# Patient Record
Sex: Female | Born: 1995 | Race: White | Hispanic: No | Marital: Single | Smoking: Former smoker
Health system: Southern US, Community
[De-identification: ages and names within clinical notes are randomized; demographics above are authoritative.]

## PROBLEM LIST (undated history)

## (undated) DIAGNOSIS — K219 Gastro-esophageal reflux disease without esophagitis: Secondary | ICD-10-CM

---
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
## 2016-06-15 ENCOUNTER — Emergency Department
Admission: EM | Admit: 2016-06-15 | Discharge: 2016-06-15 | Disposition: A | Discharge status: Home / Self Care | Payer: Worker's Compensation | Attending: Emergency Medicine | Admitting: Emergency Medicine

## 2016-06-15 DIAGNOSIS — S0031XA Abrasion of nose, initial encounter: Secondary | ICD-10-CM | POA: Insufficient documentation

## 2016-06-15 DIAGNOSIS — Y939 Activity, unspecified: Secondary | ICD-10-CM | POA: Insufficient documentation

## 2016-06-15 DIAGNOSIS — W01198A Fall on same level from slipping, tripping and stumbling with subsequent striking against other object, initial encounter: Secondary | ICD-10-CM | POA: Diagnosis not present

## 2016-06-15 DIAGNOSIS — T148XXA Other injury of unspecified body region, initial encounter: Secondary | ICD-10-CM

## 2016-06-15 DIAGNOSIS — S0081XA Abrasion of other part of head, initial encounter: Secondary | ICD-10-CM | POA: Diagnosis not present

## 2016-06-15 DIAGNOSIS — W19XXXA Unspecified fall, initial encounter: Secondary | ICD-10-CM

## 2016-06-15 DIAGNOSIS — S0990XA Unspecified injury of head, initial encounter: Secondary | ICD-10-CM

## 2016-06-15 DIAGNOSIS — Y929 Unspecified place or not applicable: Secondary | ICD-10-CM | POA: Diagnosis not present

## 2016-06-15 DIAGNOSIS — Y999 Unspecified external cause status: Secondary | ICD-10-CM | POA: Insufficient documentation

## 2016-06-15 LAB — POCT PREGNANCY, URINE: Preg Test, Ur: NEGATIVE

## 2016-06-15 NOTE — ED Provider Notes (Signed)
[facility] Emergency Department Provider Note  ____________________________________________   I have reviewed the triage vital signs and the nursing notes.   HISTORY  Chief Complaint Fall   History limited by: Not Limited   HPI Danielle Burton is a 20 y.o. female who presents to the emergency department today after a fall. The fall happened yesterday morning around 1030am (roughly 33 hours ago). She states that she tripped over her own feet and fell forward. She did hit her head and felt like she heard a crack but did not have any LOC. She did scrap her forehead and nose. She has since had a little bit of pain over that area. She denies any difficulty with breathing through her nose. No double vision or blurred vision. No nausea or vomiting.   No past medical history on file.  There are no active problems to display for this patient.   No past surgical history on file.  Prior to Admission medications   Not on File    Allergies Penicillins  No family history on file.  Social History Social History  Substance Use Topics  . Smoking status: Not on file  . Smokeless tobacco: Not on file  . Alcohol use Not on file    Review of Systems  Constitutional: Negative for fever. Cardiovascular: Negative for chest pain. Respiratory: Negative for shortness of breath. Gastrointestinal: Negative for abdominal pain, vomiting and diarrhea. Genitourinary: Negative for dysuria. Musculoskeletal: Negative for back pain. Negative for neck pain.  Skin: Abrasion to forehead and nose. Neurological: Negative for headaches, focal weakness or numbness.  10-point ROS otherwise negative.  ____________________________________________   PHYSICAL EXAM:  VITAL SIGNS: ED Triage Vitals  Enc Vitals Group     BP 06/15/16 1923 119/71     Pulse Rate 06/15/16 1923 87     Resp 06/15/16 1923 18     Temp 06/15/16 1923 98.4 F (36.9 C)     Temp Source 06/15/16 1923  Oral     SpO2 06/15/16 1923 99 %     Weight 06/15/16 1924 160 lb (72.6 kg)     Height 06/15/16 1924 5' 2.5" (1.588 m)     Head Circumference --      Peak Flow --      Pain Score 06/15/16 1924 7   Constitutional: Alert and oriented. Well appearing and in no distress. Eyes: Conjunctivae are normal. Normal extraocular movements. ENT   Head: Normocephalic. Small abrasion to bridge of nose and left forehead.      Ears: No hemotympanum.    Nose: No congestion/rhinnorhea. No deformity. No septal hematoma.    Mouth/Throat: Mucous membranes are moist.   Neck: No stridor. No midline tenderness.  Hematological/Lymphatic/Immunilogical: No cervical lymphadenopathy. Cardiovascular: Normal rate, regular rhythm.  No murmurs, rubs, or gallops.  Respiratory: Normal respiratory effort without tachypnea nor retractions. Breath sounds are clear and equal bilaterally. No wheezes/rales/rhonchi. Gastrointestinal: Soft and nontender. No distention.  Genitourinary: Deferred Musculoskeletal: Normal range of motion in all extremities. No lower extremity edema. Neurologic:  Normal speech and language. No gross focal neurologic deficits are appreciated.  Skin:  Skin is warm, dry. Hemostatic abrasions to left forehead and bridge of nose. Psychiatric: Mood and affect are normal. Speech and behavior are normal. Patient exhibits appropriate insight and judgment.  ____________________________________________    LABS (pertinent positives/negatives)  None  ____________________________________________   EKG  None  ____________________________________________    RADIOLOGY  None  ____________________________________________   PROCEDURES  Procedures  ____________________________________________  INITIAL IMPRESSION / ASSESSMENT AND PLAN / ED COURSE  Pertinent labs & imaging results that were available during my care of the patient were reviewed by me and considered in my medical  decision making (see chart for details).  Patient here after a fall roughly 33 hours ago. Do not think CT required at this time. Will plan on discharge home.   ____________________________________________   FINAL CLINICAL IMPRESSION(S) / ED DIAGNOSES  Final diagnoses:  Fall, initial encounter  Abrasion  Minor head injury, initial encounter     Note: This dictation was prepared with Dragon dictation. Any transcriptional errors that result from this process are unintentional    Phineas SemenGraydon Julias Mould, MD 06/15/16 2039

## 2016-06-15 NOTE — Discharge Instructions (Signed)
Please seek medical attention for any high fevers, chest pain, shortness of breath, change in behavior, persistent vomiting, bloody stool or any other new or concerning symptoms.  

## 2016-06-15 NOTE — ED Triage Notes (Signed)
Patient reports she fell while at work and struck face on floor.  Patient reports thinks she may have passed out.  Patient unsure why she fell.

## 2016-06-15 NOTE — ED Notes (Signed)
Pt with abrasion noted to nasal bridge, bleeding controlled and upper mid left forehead along hairline, bleeding controlled. Pt states she tripped over her feet, falling landing on face. Pt without oral trauma noted, no drainage from ears, nose noted. Pt appears in no acute distress. Pt states she may have passed out after her face hit floor, but is unsure. Pt denies visual acuity changed. Pt able to move all extremities without difficulty.

## 2017-10-20 DIAGNOSIS — F411 Generalized anxiety disorder: Secondary | ICD-10-CM | POA: Insufficient documentation

## 2017-10-20 DIAGNOSIS — F331 Major depressive disorder, recurrent, moderate: Secondary | ICD-10-CM | POA: Insufficient documentation

## 2017-10-27 ENCOUNTER — Emergency Department
Admission: EM | Admit: 2017-10-27 | Discharge: 2017-10-27 | Disposition: A | Discharge status: Home / Self Care | Payer: Commercial Managed Care - PPO | Attending: Emergency Medicine | Admitting: Emergency Medicine

## 2017-10-27 ENCOUNTER — Other Ambulatory Visit: Payer: Self-pay

## 2017-10-27 DIAGNOSIS — F17210 Nicotine dependence, cigarettes, uncomplicated: Secondary | ICD-10-CM | POA: Insufficient documentation

## 2017-10-27 DIAGNOSIS — R0982 Postnasal drip: Secondary | ICD-10-CM | POA: Insufficient documentation

## 2017-10-27 DIAGNOSIS — Z79899 Other long term (current) drug therapy: Secondary | ICD-10-CM | POA: Diagnosis not present

## 2017-10-27 DIAGNOSIS — J029 Acute pharyngitis, unspecified: Secondary | ICD-10-CM | POA: Diagnosis not present

## 2017-10-27 DIAGNOSIS — H9393 Unspecified disorder of ear, bilateral: Secondary | ICD-10-CM | POA: Insufficient documentation

## 2017-10-27 DIAGNOSIS — R112 Nausea with vomiting, unspecified: Secondary | ICD-10-CM | POA: Insufficient documentation

## 2017-10-27 DIAGNOSIS — R51 Headache: Secondary | ICD-10-CM | POA: Diagnosis not present

## 2017-10-27 DIAGNOSIS — J0100 Acute maxillary sinusitis, unspecified: Secondary | ICD-10-CM | POA: Diagnosis not present

## 2017-10-27 DIAGNOSIS — R0981 Nasal congestion: Secondary | ICD-10-CM | POA: Diagnosis present

## 2017-10-27 MED ORDER — SULFAMETHOXAZOLE-TRIMETHOPRIM 800-160 MG PO TABS: 1.0000 | ORAL_TABLET | Freq: Two times a day (BID) | ORAL | 0 refills | Status: DC

## 2017-10-27 MED ORDER — ONDANSETRON 8 MG PO TBDP
8.0000 mg | ORAL_TABLET | Freq: Once | ORAL | Status: AC
  Administered 2017-10-27: 8 mg via ORAL
  Filled 2017-10-27: qty 1

## 2017-10-27 MED ORDER — NAPROXEN 500 MG PO TABS: 500.0000 mg | ORAL_TABLET | Freq: Two times a day (BID) | ORAL | 0 refills | Status: DC

## 2017-10-27 MED ORDER — FEXOFENADINE-PSEUDOEPHED ER 60-120 MG PO TB12: 1.0000 | ORAL_TABLET | Freq: Two times a day (BID) | ORAL | 0 refills | Status: DC

## 2017-10-27 NOTE — ED Notes (Signed)
Pt ambulatory upon discharge. Verbalized understanding of discharge instructions, follow-up care and prescriptions. VSS. Skin warm and dry. A&O x4.  

## 2017-10-27 NOTE — ED Provider Notes (Signed)
[facility] Emergency Department Provider Note   ____________________________________________   First MD Initiated Contact with Patient 10/27/17 1434     (approximate)  I have reviewed the triage vital signs and the nursing notes.   HISTORY  Chief Complaint Nasal Congestion and Emesis    HPI Danielle Burton is a 22 y.o. female patient complain of nasal congestion for 5 days.  Patient states frontal headache and bilateral ear pressure.  Patient denies vision disturbance or vertigo.  Patient denies fever.  Patient also states sore throat secondary to postnasal drainage.  Patient states nausea and vomiting secondary to postnasal drainage.  Patient rates the pain as a 5/10.  Patient described it pain is "achy/pressure".  No palliative measures for complaint.  History reviewed. No pertinent past medical history.  There are no active problems to display for this patient.   History reviewed. No pertinent surgical history.  Prior to Admission medications   Medication Sig Start Date End Date Taking? Authorizing Provider  escitalopram (LEXAPRO) 10 MG tablet Take 10 mg by mouth daily.   Yes [provider]  fexofenadine-pseudoephedrine (ALLEGRA-D) 60-120 MG 12 hr tablet Take 1 tablet by mouth 2 (two) times daily. 10/27/17   Joni ReiningSmith, Ronald K, PA-C  naproxen (NAPROSYN) 500 MG tablet Take 1 tablet (500 mg total) by mouth 2 (two) times daily with a meal. 10/27/17   Joni ReiningSmith, Ronald K, PA-C  sulfamethoxazole-trimethoprim (BACTRIM DS,SEPTRA DS) 800-160 MG tablet Take 1 tablet by mouth 2 (two) times daily. 10/27/17   Joni ReiningSmith, Ronald K, PA-C    Allergies Penicillins  History reviewed. No pertinent family history.  Social History Social History   Tobacco Use  . Smoking status: Current Some Day Smoker    Packs/day: 0.50    Types: Cigarettes  Substance Use Topics  . Alcohol use: No    Frequency: Never  . Drug use: No    Review of Systems Constitutional:  No fever/chills Eyes: No visual changes. ENT: Nasal congestion ear pressure and sore throat.   Cardiovascular: Denies chest pain. Respiratory: Denies shortness of breath. Gastrointestinal: No abdominal pain.  No nausea, no vomiting.  No diarrhea.  No constipation. Genitourinary: Negative for dysuria. Musculoskeletal: Negative for back pain. Skin: Negative for rash. Neurological: Negative for headaches, focal weakness or numbness.   ____________________________________________   PHYSICAL EXAM:  VITAL SIGNS: ED Triage Vitals  Enc Vitals Group     BP 10/27/17 1355 (!) 135/58     Pulse Rate 10/27/17 1355 61     Resp 10/27/17 1355 16     Temp 10/27/17 1355 97.6 F (36.4 C)     Temp Source 10/27/17 1355 Oral     SpO2 10/27/17 1355 100 %     Weight 10/27/17 1357 140 lb (63.5 kg)     Height 10/27/17 1357 5\' 1"  (1.549 m)     Head Circumference --      Peak Flow --      Pain Score 10/27/17 1401 5     Pain Loc --      Pain Edu? --      Excl. in GC? --    Constitutional: Alert and oriented. Well appearing and in no acute distress. Eyes: Conjunctivae are normal. PERRL. EOMI. Head: Atraumatic. Nose: Bilateral edematous nasal turbinates.  Thick rhinorrhea.  Bilateral maxillary guarding.  Mouth/Throat: Mucous membranes are moist.  Oropharynx non-erythematous.  Postnasal drainage. Neck: No stridor.  No cervical spine tenderness to palpation. Hematological/Lymphatic/Immunilogical: No cervical lymphadenopathy. Cardiovascular: Normal rate,  regular rhythm. Grossly normal heart sounds.  Good peripheral circulation. Respiratory: Normal respiratory effort.  No retractions. Lungs CTAB. Gastrointestinal: Soft and nontender. No distention. No abdominal bruits. No CVA tenderness. Musculoskeletal: No lower extremity tenderness nor edema.  No joint effusions. Neurologic:  Normal speech and language. No gross focal neurologic deficits are appreciated. No gait instability. Skin:  Skin is warm, dry and  intact. No rash noted. Psychiatric: Mood and affect are normal. Speech and behavior are normal.  ____________________________________________   LABS (all labs ordered are listed, but only abnormal results are displayed)  Labs Reviewed - No data to display ____________________________________________  EKG   ____________________________________________  RADIOLOGY  ED MD interpretation:    Official radiology report(s): No results found.  ____________________________________________   PROCEDURES  Procedure(s) performed: None  Procedures  Critical Care performed: No  ____________________________________________   INITIAL IMPRESSION / ASSESSMENT AND PLAN / ED COURSE  As part of my medical decision making, I reviewed the following data within the electronic MEDICAL RECORD NUMBER    Patient presents with 5 days of nasal and sinus congestion consistent with sinusitis.  Patient given discharge care instruction advised take medication as directed.  Patient advised to follow-up with PCP if not improved between 5 and 7 days.     ____________________________________________   FINAL CLINICAL IMPRESSION(S) / ED DIAGNOSES  Final diagnoses:  Acute non-recurrent maxillary sinusitis     ED Discharge Orders        Ordered    sulfamethoxazole-trimethoprim (BACTRIM DS,SEPTRA DS) 800-160 MG tablet  2 times daily     10/27/17 1442    fexofenadine-pseudoephedrine (ALLEGRA-D) 60-120 MG 12 hr tablet  2 times daily     10/27/17 1442    naproxen (NAPROSYN) 500 MG tablet  2 times daily with meals     10/27/17 1442       Note:  This document was prepared using Dragon voice recognition software and may include unintentional dictation errors.    Joni Reining, PA-C 10/27/17 1505    Nita Sickle, MD 10/28/17 Barry Brunner

## 2017-10-27 NOTE — ED Notes (Signed)
See triage note  States she has had some vomiting several days ago  But conts to have nasal congestion  No fever

## 2017-10-27 NOTE — ED Triage Notes (Signed)
Pt reports that she has nasal congestions since Thursday. She reports that she had an episode of vomiting on Friday. Denies a fever. Reports that she has felt so bad that she has not been able to work.

## 2017-12-07 ENCOUNTER — Emergency Department
Admission: EM | Admit: 2017-12-07 | Discharge: 2017-12-08 | Disposition: A | Discharge status: Home / Self Care | Payer: Commercial Managed Care - PPO | Attending: Emergency Medicine | Admitting: Emergency Medicine

## 2017-12-07 ENCOUNTER — Other Ambulatory Visit: Payer: Self-pay

## 2017-12-07 ENCOUNTER — Emergency Department: Payer: Commercial Managed Care - PPO

## 2017-12-07 DIAGNOSIS — T402X1A Poisoning by other opioids, accidental (unintentional), initial encounter: Secondary | ICD-10-CM | POA: Insufficient documentation

## 2017-12-07 DIAGNOSIS — Y908 Blood alcohol level of 240 mg/100 ml or more: Secondary | ICD-10-CM | POA: Insufficient documentation

## 2017-12-07 DIAGNOSIS — F1010 Alcohol abuse, uncomplicated: Secondary | ICD-10-CM

## 2017-12-07 DIAGNOSIS — F17210 Nicotine dependence, cigarettes, uncomplicated: Secondary | ICD-10-CM | POA: Diagnosis not present

## 2017-12-07 DIAGNOSIS — F1994 Other psychoactive substance use, unspecified with psychoactive substance-induced mood disorder: Secondary | ICD-10-CM | POA: Diagnosis not present

## 2017-12-07 DIAGNOSIS — F1310 Sedative, hypnotic or anxiolytic abuse, uncomplicated: Secondary | ICD-10-CM

## 2017-12-07 DIAGNOSIS — F10129 Alcohol abuse with intoxication, unspecified: Secondary | ICD-10-CM | POA: Diagnosis not present

## 2017-12-07 DIAGNOSIS — T40601A Poisoning by unspecified narcotics, accidental (unintentional), initial encounter: Secondary | ICD-10-CM

## 2017-12-07 DIAGNOSIS — F329 Major depressive disorder, single episode, unspecified: Secondary | ICD-10-CM | POA: Diagnosis not present

## 2017-12-07 DIAGNOSIS — T50901A Poisoning by unspecified drugs, medicaments and biological substances, accidental (unintentional), initial encounter: Secondary | ICD-10-CM

## 2017-12-07 DIAGNOSIS — F10929 Alcohol use, unspecified with intoxication, unspecified: Secondary | ICD-10-CM

## 2017-12-07 LAB — COMPREHENSIVE METABOLIC PANEL
ALT: 20 U/L (ref 14–54)
AST: 26 U/L (ref 15–41)
Albumin: 4.1 g/dL (ref 3.5–5.0)
Alkaline Phosphatase: 47 U/L (ref 38–126)
Anion gap: 10 (ref 5–15)
BUN: 9 mg/dL (ref 6–20)
CHLORIDE: 103 mmol/L (ref 101–111)
CO2: 25 mmol/L (ref 22–32)
CREATININE: 0.53 mg/dL (ref 0.44–1.00)
Calcium: 8.3 mg/dL — ABNORMAL LOW (ref 8.9–10.3)
Glucose, Bld: 95 mg/dL (ref 65–99)
Potassium: 3.6 mmol/L (ref 3.5–5.1)
Sodium: 138 mmol/L (ref 135–145)
Total Bilirubin: 0.6 mg/dL (ref 0.3–1.2)
Total Protein: 7.5 g/dL (ref 6.5–8.1)

## 2017-12-07 LAB — CBC WITH DIFFERENTIAL/PLATELET
BASOS PCT: 1 %
Basophils Absolute: 0.1 10*3/uL (ref 0–0.1)
EOS ABS: 0.1 10*3/uL (ref 0–0.7)
EOS PCT: 1 %
HCT: 45.2 % (ref 35.0–47.0)
HEMOGLOBIN: 15.8 g/dL (ref 12.0–16.0)
Lymphocytes Relative: 23 %
Lymphs Abs: 2.2 10*3/uL (ref 1.0–3.6)
MCH: 32.2 pg (ref 26.0–34.0)
MCHC: 34.9 g/dL (ref 32.0–36.0)
MCV: 92.2 fL (ref 80.0–100.0)
MONO ABS: 0.6 10*3/uL (ref 0.2–0.9)
MONOS PCT: 6 %
NEUTROS PCT: 69 %
Neutro Abs: 6.6 10*3/uL — ABNORMAL HIGH (ref 1.4–6.5)
PLATELETS: 305 10*3/uL (ref 150–440)
RBC: 4.91 MIL/uL (ref 3.80–5.20)
RDW: 14.7 % — AB (ref 11.5–14.5)
WBC: 9.6 10*3/uL (ref 3.6–11.0)

## 2017-12-07 LAB — URINALYSIS, COMPLETE (UACMP) WITH MICROSCOPIC
Bacteria, UA: NONE SEEN
Bilirubin Urine: NEGATIVE
GLUCOSE, UA: NEGATIVE mg/dL
Hgb urine dipstick: NEGATIVE
KETONES UR: NEGATIVE mg/dL
LEUKOCYTES UA: NEGATIVE
NITRITE: NEGATIVE
PROTEIN: NEGATIVE mg/dL
Specific Gravity, Urine: 1.002 — ABNORMAL LOW (ref 1.005–1.030)
WBC, UA: NONE SEEN WBC/hpf (ref 0–5)
pH: 6 (ref 5.0–8.0)

## 2017-12-07 LAB — URINE DRUG SCREEN, QUALITATIVE (ARMC ONLY)
Amphetamines, Ur Screen: NOT DETECTED
BARBITURATES, UR SCREEN: NOT DETECTED
Benzodiazepine, Ur Scrn: NOT DETECTED
Cannabinoid 50 Ng, Ur ~~LOC~~: NOT DETECTED
Cocaine Metabolite,Ur ~~LOC~~: NOT DETECTED
MDMA (Ecstasy)Ur Screen: NOT DETECTED
METHADONE SCREEN, URINE: NOT DETECTED
Opiate, Ur Screen: POSITIVE — AB
Phencyclidine (PCP) Ur S: NOT DETECTED
TRICYCLIC, UR SCREEN: POSITIVE — AB

## 2017-12-07 LAB — SALICYLATE LEVEL: Salicylate Lvl: <7 mg/dL (ref 2.8–30.0)

## 2017-12-07 LAB — ACETAMINOPHEN LEVEL

## 2017-12-07 LAB — HCG, QUANTITATIVE, PREGNANCY: hCG, Beta Chain, Quant, S: <1 m[IU]/mL (ref <5)

## 2017-12-07 LAB — ETHANOL: ALCOHOL ETHYL (B): 260 mg/dL — AB (ref <10)

## 2017-12-07 MED ORDER — SODIUM CHLORIDE 0.9 % IV BOLUS
1000.0000 mL | Freq: Once | INTRAVENOUS | Status: AC
  Administered 2017-12-07: 1000 mL via INTRAVENOUS

## 2017-12-07 NOTE — ED Triage Notes (Addendum)
Per EMS, pt was unresponsive at a friends house, friend states ETOH, unknown if other substance was ingested. EMS gave  of narcan with response. EMS reports pt was combative with them and pulled out her IV. On arrival pt alert to painful stimuli, otherwise unresp. PT has equal and unlabored resp noted. EDP in rm.

## 2017-12-07 NOTE — ED Provider Notes (Signed)
[facility] Emergency Department Provider Note  ____________________________________________   First MD Initiated Contact with Patient 12/07/17 2217     (approximate)  I have reviewed the triage vital signs and the nursing notes.   HISTORY  Chief Complaint Ingestion  History limited by the patient's clinical condition  HPI Danielle Burton is a 22 y.o. female who comes to the emergency department via EMS after a possible overdose.  EMS was called out for an unresponsive patient and when they got there the patient's roommate said the patient had been drinking alcohol but then was evasive about answering further questions.  EMS noted the patient had a low respiratory rate and pinpoint pupils so they administered 4 mg of Narcan intranasally with a good response.  They subsequently gave 1 mg IV and the patient became combative in route.  History reviewed. No pertinent past medical history.  There are no active problems to display for this patient.   History reviewed. No pertinent surgical history.  Prior to Admission medications   Medication Sig Start Date End Date Taking? Authorizing Provider  escitalopram (LEXAPRO) 10 MG tablet Take 10 mg by mouth daily.   Yes [provider]  naloxone Jonelle Sports) 2 MG/2ML injection Use intranasal as needed for opiate overdose 12/08/17   Merrily Brittle, MD    Allergies Penicillins  No family history on file.  Social History Social History   Tobacco Use  . Smoking status: Current Some Day Smoker    Packs/day: 0.50    Types: Cigarettes  . Smokeless tobacco: Never Used  Substance Use Topics  . Alcohol use: Yes    Frequency: Never  . Drug use: No    Review of Systems History is limited by the patient's clinical condition  ____________________________________________   PHYSICAL EXAM:  VITAL SIGNS: ED Triage Vitals  Enc Vitals Group     BP      Pulse      Resp      Temp      Temp src    SpO2      Weight      Height      Head Circumference      Peak Flow      Pain Score      Pain Loc      Pain Edu?      Excl. in GC?     Constitutional: Somnolent and minimally arousable Eyes: PERRL EOMI. midrange and brisk Head: Atraumatic. Nose: No congestion/rhinnorhea. Mouth/Throat: No trismus Neck: No stridor.   Cardiovascular: Normal rate, regular rhythm. Grossly normal heart sounds.  Good peripheral circulation. Respiratory: Somewhat decreased respiratory effort although clear to auscultation Gastrointestinal: Soft nontender Musculoskeletal: No lower extremity edema   Neurologic: Moves all 4 Skin:  Skin is warm, dry and intact. No rash noted. Psychiatric: Somnolent   ____________________________________________   DIFFERENTIAL includes but not limited to  Alcohol intoxication, heroin overdose, metabolic derangement, dehydration ____________________________________________   LABS (all labs ordered are listed, but only abnormal results are displayed)  Labs Reviewed  COMPREHENSIVE METABOLIC PANEL - Abnormal; Notable for the following components:      Result Value   Calcium 8.3 (*)    All other components within normal limits  ACETAMINOPHEN LEVEL - Abnormal; Notable for the following components:   Acetaminophen (Tylenol), Serum <10 (*)    All other components within normal limits  ETHANOL - Abnormal; Notable for the following components:   Alcohol, Ethyl (B) 260 (*)    All  other components within normal limits  CBC WITH DIFFERENTIAL/PLATELET - Abnormal; Notable for the following components:   RDW 14.7 (*)    Neutro Abs 6.6 (*)    All other components within normal limits  URINE DRUG SCREEN, QUALITATIVE ([facility] ONLY) - Abnormal; Notable for the following components:   Tricyclic, Ur Screen POSITIVE (*)    Opiate, Ur Screen POSITIVE (*)    All other components within normal limits  URINALYSIS, COMPLETE (UACMP) WITH MICROSCOPIC - Abnormal; Notable for the following  components:   Color, Urine COLORLESS (*)    APPearance CLEAR (*)    Specific Gravity, Urine 1.002 (*)    All other components within normal limits  SALICYLATE LEVEL  HCG, QUANTITATIVE, PREGNANCY    Lab work reviewed by me shows the patient is significantly intoxicated with alcohol as well as opiate and try cyclic positive.  Stress likely could very well be Benadryl to enhance the opioids __________________________________________  EKG  ED ECG REPORT I, Merrily Brittle, the attending physician, personally viewed and interpreted this ECG.  Date: 12/08/2017 EKG Time:  Rate: 94 Rhythm: normal sinus rhythm QRS Axis: normal Intervals: normal ST/T Wave abnormalities: normal Narrative Interpretation: no evidence of acute ischemia  ____________________________________________  RADIOLOGY  Chest x-ray reviewed by me with no acute disease ____________________________________________   PROCEDURES  Procedure(s) performed: no  Procedures  Critical Care performed: no  Observation: no ____________________________________________   INITIAL IMPRESSION / ASSESSMENT AND PLAN / ED COURSE  Pertinent labs & imaging results that were available during my care of the patient were reviewed by me and considered in my medical decision making (see chart for details).  On arrival the patient is somnolent but arousable.  She has a slightly low respiratory rate but not enough to give more Narcan.  Broad labs are pending including a chest x-ray as she was reversed with Narcan.  ----------------------------------------- 1:11 AM on 12/08/2017 -----------------------------------------  The patient's drug screen is back opiate and tricyclic positive.  The tricyclics could very well be Benadryl to enhance the opiates.  Patient's parents are at bedside and I had a lengthy discussion.  At this point patient is still too somnolent to safely discharged home.  I have consulted TTS to provide the parents  with resources.  Care signed over to Dr. Zenda Alpers who will determine the ultimate disposition.      ____________________________________________   FINAL CLINICAL IMPRESSION(S) / ED DIAGNOSES  Final diagnoses:  Alcoholic intoxication with complication (HCC)  Accidental drug overdose, initial encounter  Opiate overdose, accidental or unintentional, initial encounter (HCC)      NEW MEDICATIONS STARTED DURING THIS VISIT:  New Prescriptions   NALOXONE (NARCAN) 2 MG/2ML INJECTION    Use intranasal as needed for opiate overdose     Note:  This document was prepared using Dragon voice recognition software and may include unintentional dictation errors.     Merrily Brittle, MD 12/08/17 0111

## 2017-12-07 NOTE — ED Notes (Signed)
Pt continuing to sleep at this time, equal and unlabored resp noted. Family at bedside.

## 2017-12-08 DIAGNOSIS — F1994 Other psychoactive substance use, unspecified with psychoactive substance-induced mood disorder: Secondary | ICD-10-CM | POA: Diagnosis not present

## 2017-12-08 DIAGNOSIS — F1310 Sedative, hypnotic or anxiolytic abuse, uncomplicated: Secondary | ICD-10-CM

## 2017-12-08 DIAGNOSIS — F1010 Alcohol abuse, uncomplicated: Secondary | ICD-10-CM

## 2017-12-08 MED ORDER — CHLORDIAZEPOXIDE HCL 25 MG PO CAPS
25.0000 mg | ORAL_CAPSULE | Freq: Three times a day (TID) | ORAL | Status: DC
  Administered 2017-12-08: 25 mg via ORAL
  Filled 2017-12-08: qty 1

## 2017-12-08 MED ORDER — NALOXONE HCL 4 MG/0.1ML NA LIQD: 1.0000 | Freq: Once | NASAL | Status: DC

## 2017-12-08 MED ORDER — VITAMIN B-1 100 MG PO TABS
100.0000 mg | ORAL_TABLET | Freq: Every day | ORAL | Status: DC
  Administered 2017-12-08: 100 mg via ORAL
  Filled 2017-12-08: qty 1

## 2017-12-08 MED ORDER — FOLIC ACID 1 MG PO TABS
1.0000 mg | ORAL_TABLET | Freq: Every day | ORAL | Status: DC
  Administered 2017-12-08: 1 mg via ORAL
  Filled 2017-12-08: qty 1

## 2017-12-08 MED ORDER — NALOXONE HCL 2 MG/2ML IJ SOSY: PREFILLED_SYRINGE | INTRAMUSCULAR | 2 refills | Status: DC

## 2017-12-08 MED ORDER — SODIUM CHLORIDE 0.9 % IV BOLUS
1000.0000 mL | Freq: Once | INTRAVENOUS | Status: AC
  Administered 2017-12-08: 1000 mL via INTRAVENOUS

## 2017-12-08 NOTE — ED Notes (Signed)
Pt dressed out in burgandy scrubs.  Pt belongings (jewelry and cell phone) handed off to pt's father at this time.

## 2017-12-08 NOTE — ED Provider Notes (Signed)
Seen by psych SOC, recommends voluntary admission.   Danielle Burton, RoJene Every/[birth year] [phone]

## 2017-12-08 NOTE — BH Assessment (Signed)
Danielle Burton  Danielle Burton is an 22 y.o. female. Danielle Burton arrived to the ED by way of EMS. She report that she was talking to her friend and did not think she was too drunk, "but I guess I blacked out".  She reports that she has no idea how much she drank.  She states "I had a couple beers, and then it went on to liquor".  She reports that she was at her friends house.  She states that she had taken opioids (Klonipines).  She shared that she remembered taking 3 -  tablets.  She states that she cannot remember past 8 o'clock.  She states "I am always depressed" She states that she is on anti-depressants and always feels sad.  She denied symptoms of anxiety.  She denied having auditory or visual hallucinations.  She denied suicidal and homicidal ideation or intent.   She denied stressors or life changes.  Diagnosis: Depression, Substance use disorder  Past Medical History: History reviewed. No pertinent past medical history.  History reviewed. No pertinent surgical history.  Family History: No family history on file.  Social History:  reports that she has been smoking cigarettes.  She has been smoking about 0.50 packs per day. She has never used smokeless tobacco. She reports that she drinks alcohol. She reports that she does not use drugs.  Additional Social History:  Alcohol / Drug Use History of alcohol / drug use?: Yes Substance #1 Name of Substance 1: Alcohol 1 - Age of First Use: 15 1 - Amount (size/oz): "quite a bit" 1 - Frequency: 3 nights a week 1 - Last Use / Amount: 12/07/2017 Substance #2 Name of Substance 2: opiods 2 - Age of First Use: 20 2 - Amount (size/oz): "Quite a bit" 2 - Frequency: 3-4 days a week 2 - Last Use / Amount: 12/07/2017  CIWA: CIWA-Ar BP: (!) 87/48 Pulse Rate: 80 COWS:    Allergies:  Allergies  Allergen Reactions  . Penicillins Rash    Home Medications:  (Not in a hospital admission)  OB/GYN Status:  No LMP recorded.  General  Danielle Data Location of Danielle: [facility] ED TTS Danielle: In system Is this a Tele or Face-to-Face Danielle?: Face-to-Face Is this an Initial Danielle or a Re-Danielle for this encounter?: Initial Danielle Marital status: Single Maiden name: Danielle Burton Is patient pregnant?: No Pregnancy Status: No Living Arrangements: Parent Can pt return to current living arrangement?: Yes Admission Status: Voluntary Is patient capable of signing voluntary admission?: Yes Referral Source: Self/Family/Friend Insurance type: Designer, industrial/product Exam [facility] Walk-in ONLY) Medical Exam completed: Yes  Crisis Care Plan Living Arrangements: Parent Legal Guardian: Other:(Self) Name of Psychiatrist: None Name of Therapist: None  Education Status Is patient currently in school?: No Is the patient employed, unemployed or receiving disability?: Employed  Risk to self with the past 6 months Suicidal Ideation: No Has patient been a risk to self within the past 6 months prior to admission? : No Suicidal Intent: No Has patient had any suicidal intent within the past 6 months prior to admission? : No Is patient at risk for suicide?: No Suicidal Plan?: No Has patient had any suicidal plan within the past 6 months prior to admission? : No Access to Means: No What has been your use of drugs/alcohol within the last 12 months?: "I have done a lot of drugs" Previous Attempts/Gestures: No How many times?: 0 Other Self Harm Risks: denied Triggers for Past Attempts: None known Intentional Self Injurious Behavior:  None Family Suicide History: Yes(Aunt, paternal grandmother) Recent stressful life event(s): (denied) Persecutory voices/beliefs?: No Depression: Yes Depression Symptoms: Tearfulness Substance abuse history and/or treatment for substance abuse?: Yes Suicide prevention information given to non-admitted patients: Not applicable  Risk to Others within the past 6 months Homicidal  Ideation: No Does patient have any lifetime risk of violence toward others beyond the six months prior to admission? : No Thoughts of Harm to Others: No Current Homicidal Intent: No Current Homicidal Plan: No Access to Homicidal Means: No Identified Victim: None identified History of harm to others?: No Danielle of Violence: None Noted Violent Behavior Description: denied Does patient have access to weapons?: No Criminal Charges Pending?: No Does patient have a court date: No Is patient on probation?: No  Psychosis Hallucinations: None noted Delusions: None noted  Mental Status Report Appearance/Hygiene: In hospital gown Eye Contact: Fair Motor Activity: Unremarkable Speech: Logical/coherent Level of Consciousness: Alert Mood: Other (Comment)(lauging at incongruent moments) Affect: Sad Anxiety Level: None Thought Processes: Coherent Judgement: Partial Orientation: Appropriate for developmental age Obsessive Compulsive Thoughts/Behaviors: None  Cognitive Functioning Concentration: Good Memory: Remote Intact Is patient IDD: No Is patient DD?: No Insight: Poor Impulse Control: Poor Appetite: Poor Have you had any weight changes? : No Change Sleep: Decreased Vegetative Symptoms: None  ADLScreening [facility] Danielle Services) Patient's cognitive ability adequate to safely complete daily activities?: Yes Patient able to express need for assistance with ADLs?: Yes Independently performs ADLs?: Yes (appropriate for developmental age)  Prior Inpatient Therapy Prior Inpatient Therapy: No  Prior Outpatient Therapy Prior Outpatient Therapy: Yes Prior Therapy Dates: 2017 and prior Prior Therapy Facilty/Provider(s): "Some place in [place]" Reason for Treatment: depression and anxiety Does patient have an ACCT team?: No Does patient have Intensive In-House Services?  : No Does patient have [facility] services? : No Does patient have P4CC services?: No  ADL Screening  (condition at time of admission) Patient's cognitive ability adequate to safely complete daily activities?: Yes Is the patient deaf or have difficulty hearing?: No Does the patient have difficulty seeing, even when wearing glasses/contacts?: No Does the patient have difficulty concentrating, remembering, or making decisions?: No Patient able to express need for assistance with ADLs?: Yes Does the patient have difficulty dressing or bathing?: No Independently performs ADLs?: Yes (appropriate for developmental age) Does the patient have difficulty walking or climbing stairs?: No Weakness of Legs: None Weakness of Arms/Hands: None  Home Assistive Devices/Equipment Home Assistive Devices/Equipment: None    Abuse/Neglect Danielle (Danielle to be complete while patient is alone) Abuse/Neglect Danielle Can Be Completed: (denied history of abuse)     Advance Directives (For Healthcare) Does Patient Have a Medical Advance Directive?: (Unable to assess, pt unresp at this time)          Disposition:  Disposition Initial Danielle Completed for this Encounter: Yes  On Site Evaluation by:   Reviewed with Physician:    Justice Deeds 12/08/2017 5:34 AM

## 2017-12-08 NOTE — ED Provider Notes (Signed)
Patient cleared for discharge by Dr. Toni Amend of psychiatry after his evaluation    [name], MD 12/08/17 1158

## 2017-12-08 NOTE — ED Notes (Signed)
Called [facility] for consult  [phone]

## 2017-12-08 NOTE — ED Notes (Signed)
SOC set up in interview room, will walk patient into interview room when notified of call from [facility].

## 2017-12-08 NOTE — ED Notes (Signed)
Assessment could not be completed at this time, as patient could not be aroused due to elevated alcohol level and substance use.

## 2017-12-08 NOTE — ED Notes (Signed)
Pt given sprite to drink. 

## 2017-12-08 NOTE — Consult Note (Signed)
[place] Psychiatry Consult   Reason for Consult: Consult for 22 year old woman brought to the emergency room last night after police were apparently called to where she had had a blackout Referring Physician: Corky Downs Patient Identification: Danielle Burton MRN:  [id number] Principal Diagnosis: Substance induced mood disorder ([place]) Diagnosis:   Patient Active Problem List   Diagnosis Date Noted  . Alcohol abuse [F10.10] 12/08/2017  . Substance induced mood disorder ([place]) [F19.94] 12/08/2017  . Benzodiazepine abuse ([place]) [F13.10] 12/08/2017    Total Time spent with patient: 1 hour  Subjective:   Danielle Burton is a 22 y.o. female patient admitted with "I really do not remember"  HPI: Patient seen chart reviewed.  22 year old woman came to the emergency room last night.  Apparently EMS got called because the patient had taken some pills after drinking heavily and blacked out.  Patient says the last thing she remembers was yesterday evening she was drinking with some friends.  Does not know how much she had to drink but knows that she was drinking beer and then started drinking liquor.  Significantly more she thinks and she does most days.  She took some pills given to her by some friends which she was told were Klonopin.  Next thing she knew she was here in the hospital.  Patient says that she only did it because she was trying to get high and have fun.  She denies that she is been having any suicidal thoughts at all.  She says she has chronic depression but it has not been any worse than usual.  Usually sleeps and eats okay.  Currently does see a provider who prescribes Lexapro for her.  Denies any psychotic symptoms.  Patient claims that she drinks only occasionally not every day.  Denies that she is abusing any other drugs.  Social history: Lives with her parents.  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  Medical history: No significant medical problems  Substance abuse  history: Sounds like she has had problems with intermittent abuse of alcohol and other drugs although she has little insight into it and has not engaged in any treatment.  Past Psychiatric History: No history of suicide attempts no history of hospitalization.  Currently prescribed Lexapro.  Risk to Self: Suicidal Ideation: No Suicidal Intent: No Is patient at risk for suicide?: No Suicidal Plan?: No Access to Means: No What has been your use of drugs/alcohol within the last 12 months?: "I have done a lot of drugs" How many times?: 0 Other Self Harm Risks: denied Triggers for Past Attempts: None known Intentional Self Injurious Behavior: None Risk to Others: Homicidal Ideation: No Thoughts of Harm to Others: No Current Homicidal Intent: No Current Homicidal Plan: No Access to Homicidal Means: No Identified Victim: None identified History of harm to others?: No Assessment of Violence: None Noted Violent Behavior Description: denied Does patient have access to weapons?: No Criminal Charges Pending?: No Does patient have a court date: No Prior Inpatient Therapy: Prior Inpatient Therapy: No Prior Outpatient Therapy: Prior Outpatient Therapy: Yes Prior Therapy Dates: 2017 and prior Prior Therapy Facilty/Provider(s): "Some place in [place]" Reason for Treatment: depression and anxiety Does patient have an ACCT team?: No Does patient have Intensive In-House Services?  : No Does patient have [facility] services? : No Does patient have P4CC services?: No  Past Medical History: History reviewed. No pertinent past medical history. History reviewed. No pertinent surgical history. Family History: No family history on file. Family Psychiatric  History:  Grandmother had depression Social History:  Social History   Substance and Sexual Activity  Alcohol Use Yes  . Frequency: Never     Social History   Substance and Sexual Activity  Drug Use No    Social History   Socioeconomic  History  . Marital status: Single    Spouse name: Not on file  . Number of children: Not on file  . Years of education: Not on file  . Highest education level: Not on file  Occupational History  . Not on file  Social Needs  . Financial resource strain: Not on file  . Food insecurity:    Worry: Not on file    Inability: Not on file  . Transportation needs:    Medical: Not on file    Non-medical: Not on file  Tobacco Use  . Smoking status: Current Some Day Smoker    Packs/day: 0.50    Types: Cigarettes  . Smokeless tobacco: Never Used  Substance and Sexual Activity  . Alcohol use: Yes    Frequency: Never  . Drug use: No  . Sexual activity: Not on file  Lifestyle  . Physical activity:    Days per week: Not on file    Minutes per session: Not on file  . Stress: Not on file  Relationships  . Social connections:    Talks on phone: Not on file    Gets together: Not on file    Attends religious service: Not on file    Active member of club or organization: Not on file    Attends meetings of clubs or organizations: Not on file    Relationship status: Not on file  Other Topics Concern  . Not on file  Social History Narrative  . Not on file   Additional Social History:    Allergies:   Allergies  Allergen Reactions  . Penicillins Rash    Labs:  Results for orders placed or performed during the hospital encounter of 12/07/17 (from the past 48 hour(s))  Comprehensive metabolic panel     Status: Abnormal   Collection Time: 12/07/17 10:19 PM  Result Value Ref Range   Sodium 138 135 - 145 mmol/L   Potassium 3.6 3.5 - 5.1 mmol/L    Comment: HEMOLYSIS AT THIS LEVEL MAY AFFECT RESULT   Chloride 103 101 - 111 mmol/L   CO2 25 22 - 32 mmol/L   Glucose, Bld 95 65 - 99 mg/dL   BUN 9 6 - 20 mg/dL   Creatinine, Ser 0.53 0.44 - 1.00 mg/dL   Calcium 8.3 (L) 8.9 - 10.3 mg/dL   Total Protein 7.5 6.5 - 8.1 g/dL   Albumin 4.1 3.5 - 5.0 g/dL   AST 26 15 - 41 U/L   ALT 20 14 - 54  U/L   Alkaline Phosphatase 47 38 - 126 U/L   Total Bilirubin 0.6 0.3 - 1.2 mg/dL   GFR calc non Af Amer >60 >60 mL/min   GFR calc Af Amer >60 >60 mL/min    Comment: (NOTE) The eGFR has been calculated using the CKD EPI equation. This calculation has not been validated in all clinical situations. eGFR's persistently <60 mL/min signify possible Chronic Kidney Disease.    Anion gap 10 5 - 15    Comment: Performed at [facility], [address] [place], [place] [address]  Acetaminophen level     Status: Abnormal   Collection Time: 12/07/17 10:19 PM  Result Value Ref Range  Acetaminophen (Tylenol), Serum <10 (L) 10 - 30 ug/mL    Comment:        THERAPEUTIC CONCENTRATIONS VARY SIGNIFICANTLY. A RANGE OF 10-30 ug/mL MAY BE AN EFFECTIVE CONCENTRATION FOR MANY PATIENTS. HOWEVER, SOME ARE BEST TREATED AT CONCENTRATIONS OUTSIDE THIS RANGE. ACETAMINOPHEN CONCENTRATIONS >150 ug/mL AT 4 HOURS AFTER INGESTION AND >50 ug/mL AT 12 HOURS AFTER INGESTION ARE OFTEN ASSOCIATED WITH TOXIC REACTIONS. Performed at [facility], [address] [place], [place] [address]   Ethanol     Status: Abnormal   Collection Time: 12/07/17 10:19 PM  Result Value Ref Range   Alcohol, Ethyl (B) 260 (H) <10 mg/dL    Comment:        LOWEST DETECTABLE LIMIT FOR SERUM ALCOHOL IS 10 mg/dL FOR MEDICAL PURPOSES ONLY Performed at [facility], [address] [place], [place] [address]   Salicylate level     Status: None   Collection Time: 12/07/17 10:19 PM  Result Value Ref Range   Salicylate Lvl <1.0 2.8 - 30.0 mg/dL    Comment: HEMOLYSIS AT THIS LEVEL MAY AFFECT RESULT Performed at [facility], [address] [place], [place] [address]   CBC with Differential     Status: Abnormal   Collection Time: 12/07/17 10:19 PM  Result Value Ref Range   WBC 9.6 3.6 - 11.0 K/uL   RBC 4.91 3.80 - 5.20 MIL/uL   Hemoglobin 15.8 12.0 - 16.0 g/dL   HCT 45.2 35.0 - 47.0 %   MCV  92.2 80.0 - 100.0 fL   MCH 32.2 26.0 - 34.0 pg   MCHC 34.9 32.0 - 36.0 g/dL   RDW 14.7 (H) 11.5 - 14.5 %   Platelets 305 150 - 440 K/uL   Neutrophils Relative % 69 %   Neutro Abs 6.6 (H) 1.4 - 6.5 K/uL   Lymphocytes Relative 23 %   Lymphs Abs 2.2 1.0 - 3.6 K/uL   Monocytes Relative 6 %   Monocytes Absolute 0.6 0.2 - 0.9 K/uL   Eosinophils Relative 1 %   Eosinophils Absolute 0.1 0 - 0.7 K/uL   Basophils Relative 1 %   Basophils Absolute 0.1 0 - 0.1 K/uL    Comment: Performed at [facility], [address] [place], [place] [address]  Urine Drug Screen, Qualitative     Status: Abnormal   Collection Time: 12/07/17 10:19 PM  Result Value Ref Range   Tricyclic, Ur Screen POSITIVE (A) NONE DETECTED   Amphetamines, Ur Screen NONE DETECTED NONE DETECTED   MDMA (Ecstasy)Ur Screen NONE DETECTED NONE DETECTED   Cocaine Metabolite,Ur [place] NONE DETECTED NONE DETECTED   Opiate, Ur Screen POSITIVE (A) NONE DETECTED   Phencyclidine (PCP) Ur S NONE DETECTED NONE DETECTED   Cannabinoid 50 Ng, Ur [place] NONE DETECTED NONE DETECTED   Barbiturates, Ur Screen NONE DETECTED NONE DETECTED   Benzodiazepine, Ur Scrn NONE DETECTED NONE DETECTED   Methadone Scn, Ur NONE DETECTED NONE DETECTED    Comment: (NOTE) Tricyclics + metabolites, urine    Cutoff 1000 ng/mL Amphetamines + metabolites, urine  Cutoff 1000 ng/mL MDMA (Ecstasy), urine              Cutoff 500 ng/mL Cocaine Metabolite, urine          Cutoff 300 ng/mL Opiate + metabolites, urine        Cutoff 300 ng/mL Phencyclidine (PCP), urine         Cutoff 25 ng/mL Cannabinoid, urine  Cutoff 50 ng/mL Barbiturates + metabolites, urine  Cutoff 200 ng/mL Benzodiazepine, urine              Cutoff 200 ng/mL Methadone, urine                   Cutoff 300 ng/mL The urine drug screen provides only a preliminary, unconfirmed analytical test result and should not be used for non-medical purposes. Clinical consideration and professional  judgment should be applied to any positive drug screen result due to possible interfering substances. A more specific alternate chemical method must be used in order to obtain a confirmed analytical result. Gas chromatography / mass spectrometry (GC/MS) is the preferred confirmat ory method. Performed at [facility], [address] [place], [place] [address]   Urinalysis, Complete w Microscopic     Status: Abnormal   Collection Time: 12/07/17 10:19 PM  Result Value Ref Range   Color, Urine COLORLESS (A) YELLOW   APPearance CLEAR (A) CLEAR   Specific Gravity, Urine 1.002 (L) 1.005 - 1.030   pH 6.0 5.0 - 8.0   Glucose, UA NEGATIVE NEGATIVE mg/dL   Hgb urine dipstick NEGATIVE NEGATIVE   Bilirubin Urine NEGATIVE NEGATIVE   Ketones, ur NEGATIVE NEGATIVE mg/dL   Protein, ur NEGATIVE NEGATIVE mg/dL   Nitrite NEGATIVE NEGATIVE   Leukocytes, UA NEGATIVE NEGATIVE   WBC, UA NONE SEEN 0 - 5 WBC/hpf   Bacteria, UA NONE SEEN NONE SEEN   Squamous Epithelial / LPF 0-5 0 - 5    Comment: Please note change in reference range. Performed at [facility], [address] [place], [place] [address]   hCG, quantitative, pregnancy     Status: None   Collection Time: 12/07/17 10:19 PM  Result Value Ref Range   hCG, Beta Chain, Quant, S <1 <5 mIU/mL    Comment:          GEST. AGE      CONC.  (mIU/mL)   <=1 WEEK        5 - 50     2 WEEKS       50 - 500     3 WEEKS       100 - 10,000     4 WEEKS     1,000 - 30,000     5 WEEKS     3,500 - 115,000   6-8 WEEKS     12,000 - 270,000    12 WEEKS     15,000 - 220,000        FEMALE AND NON-PREGNANT FEMALE:     LESS THAN 5 mIU/mL Performed at [facility], [address] [place], [place] [address]     No current facility-administered medications for this encounter.    Current Outpatient Medications  Medication Sig Dispense Refill  . escitalopram (LEXAPRO) 10 MG tablet Take 10 mg by mouth daily.    . naloxone (NARCAN)  2 MG/2ML injection Use intranasal as needed for opiate overdose 2 mL 2    Musculoskeletal: Strength & Muscle Tone: within normal limits Gait & Station: normal Patient leans: N/A  Psychiatric Specialty Exam: Physical Exam  Nursing note and vitals reviewed. Constitutional: She appears well-developed and well-nourished.  HENT:  Head: Normocephalic and atraumatic.  Eyes: Pupils are equal, round, and reactive to light. Conjunctivae are normal.  Neck: Normal range of motion.  Cardiovascular: Regular rhythm and normal heart sounds.  Respiratory: Effort normal. No respiratory distress.  GI: Soft.  Musculoskeletal: Normal  range of motion.  Neurological: She is alert.  Skin: Skin is warm and dry.  Psychiatric: She has a normal mood and affect. Her behavior is normal. Judgment and thought content normal.    Review of Systems  Constitutional: Negative.   HENT: Negative.   Eyes: Negative.   Respiratory: Negative.   Cardiovascular: Negative.   Gastrointestinal: Negative.   Musculoskeletal: Negative.   Skin: Negative.   Neurological: Negative.   Psychiatric/Behavioral: Positive for memory loss and substance abuse. Negative for depression, hallucinations and suicidal ideas. The patient is not nervous/anxious and does not have insomnia.     Blood pressure 120/79, pulse 98, temperature 97.6 F (36.4 C), temperature source Axillary, resp. rate 16, height _0  (1.626 m), weight 59 kg (130 lb), SpO2 99 %.Body mass index is 22.31 kg/m.  General Appearance: Casual  Eye Contact:  Good  Speech:  Clear and Coherent  Volume:  Decreased  Mood:  Euthymic  Affect:  Congruent  Thought Process:  Goal Directed  Orientation:  Full (Time, Place, and Person)  Thought Content:  Logical  Suicidal Thoughts:  No  Homicidal Thoughts:  No  Memory:  Immediate;   Fair Recent;   Fair Remote;   Fair  Judgement:  Impaired  Insight:  Shallow  Psychomotor Activity:  Normal  Concentration:  Concentration:  Fair  Recall:  [employer] of Knowledge:  Fair  Language:  Fair  Akathisia:  No  Handed:  Right  AIMS (if indicated):     Assets:  Communication Skills Housing Physical Health Resilience  ADL's:  Intact  Cognition:  WNL  Sleep:        Treatment Plan Summary: Plan 22 year old woman who came to the hospital intoxicated with drug screen positive for opiates.  Patient does not even know what pills she took but it does not appear that there is any evidence that she was trying to kill herself.  Says she has chronic mild depression but is not particularly sad or depressed now denies any suicidal ideation.  No sign of acute dangerousness.  Patient was counseled about the obvious dangers of combinations of alcohol and sedative drugs with possible fatal outcome.  Strongly encouraged to consider whether alcohol may be a problem for her.  No need to change her current antidepressant no prescriptions were written.  Discontinue IVC she can follow-up with local provider.  Disposition: No evidence of imminent risk to self or others at present.   Patient does not meet criteria for psychiatric inpatient admission. Supportive therapy provided about ongoing stressors.  Alethia Berthold, MD 12/08/2017 6:17 PM

## 2017-12-08 NOTE — ED Notes (Signed)
Pt ambulatory to [facility] at this time.

## 2017-12-08 NOTE — ED Notes (Signed)
Pt taken to interview room at this time, [facility] set up and awaiting call.

## 2017-12-08 NOTE — ED Notes (Signed)
BEHAVIORAL HEALTH ROUNDING Patient sleeping: No. Patient alert and oriented: yes Behavior appropriate: Yes.  ; If no, describe:  Nutrition and fluids offered: yes Toileting and hygiene offered: Yes  Sitter present: q15 minute observations and security monitoring Law enforcement present: Yes    

## 2018-03-09 ENCOUNTER — Other Ambulatory Visit
Admission: RE | Admit: 2018-03-09 | Discharge: 2018-03-09 | Disposition: A | Discharge status: Home / Self Care | Payer: Commercial Managed Care - PPO | Source: Ambulatory Visit | Attending: Adult Health | Admitting: Adult Health

## 2018-03-09 ENCOUNTER — Ambulatory Visit
Admission: RE | Admit: 2018-03-09 | Discharge: 2018-03-09 | Disposition: A | Discharge status: Home / Self Care | Payer: Commercial Managed Care - PPO | Source: Ambulatory Visit | Attending: Adult Health | Admitting: Adult Health

## 2018-03-09 ENCOUNTER — Encounter: Payer: Self-pay | Admitting: Medical

## 2018-03-09 ENCOUNTER — Telehealth: Payer: Self-pay | Admitting: Adult Health

## 2018-03-09 ENCOUNTER — Ambulatory Visit: Payer: Self-pay | Admitting: Medical

## 2018-03-09 VITALS — BP 136/92 | HR 79 | Temp 99.1°F | Resp 16 | Wt 148.6 lb

## 2018-03-09 DIAGNOSIS — R1031 Right lower quadrant pain: Secondary | ICD-10-CM

## 2018-03-09 DIAGNOSIS — N8312 Corpus luteum cyst of left ovary: Secondary | ICD-10-CM | POA: Diagnosis not present

## 2018-03-09 LAB — COMPREHENSIVE METABOLIC PANEL
ALK PHOS: 51 U/L (ref 38–126)
ALT: 18 U/L (ref 0–44)
AST: 20 U/L (ref 15–41)
Albumin: 4.6 g/dL (ref 3.5–5.0)
Anion gap: 8 (ref 5–15)
BUN: 14 mg/dL (ref 6–20)
CALCIUM: 8.9 mg/dL (ref 8.9–10.3)
CO2: 27 mmol/L (ref 22–32)
Chloride: 103 mmol/L (ref 98–111)
Creatinine, Ser: 0.72 mg/dL (ref 0.44–1.00)
Glucose, Bld: 93 mg/dL (ref 70–99)
Potassium: 4.1 mmol/L (ref 3.5–5.1)
Sodium: 138 mmol/L (ref 135–145)
Total Bilirubin: 0.5 mg/dL (ref 0.3–1.2)
Total Protein: 7.8 g/dL (ref 6.5–8.1)

## 2018-03-09 LAB — POCT URINALYSIS DIPSTICK
BILIRUBIN UA: NEGATIVE
GLUCOSE UA: NEGATIVE
KETONES UA: NEGATIVE
Leukocytes, UA: NEGATIVE
Nitrite, UA: NEGATIVE
Protein, UA: NEGATIVE
RBC UA: NEGATIVE
Urobilinogen, UA: 0.2 E.U./dL
pH, UA: 7.5 (ref 5.0–8.0)

## 2018-03-09 LAB — POCT URINE PREGNANCY: Preg Test, Ur: NEGATIVE

## 2018-03-09 LAB — CBC WITH DIFFERENTIAL/PLATELET
BASOS PCT: 1 %
Basophils Absolute: 0.1 10*3/uL (ref 0–0.1)
EOS ABS: 0 10*3/uL (ref 0–0.7)
Eosinophils Relative: 0 %
HCT: 41.9 % (ref 35.0–47.0)
HEMOGLOBIN: 14.3 g/dL (ref 12.0–16.0)
Lymphocytes Relative: 24 %
Lymphs Abs: 1.9 10*3/uL (ref 1.0–3.6)
MCH: 31.7 pg (ref 26.0–34.0)
MCHC: 34.2 g/dL (ref 32.0–36.0)
MCV: 92.6 fL (ref 80.0–100.0)
MONOS PCT: 5 %
Monocytes Absolute: 0.4 10*3/uL (ref 0.2–0.9)
NEUTROS PCT: 70 %
Neutro Abs: 5.6 10*3/uL (ref 1.4–6.5)
PLATELETS: 243 10*3/uL (ref 150–440)
RBC: 4.53 MIL/uL (ref 3.80–5.20)
RDW: 12.7 % (ref 11.5–14.5)
WBC: 8.1 10*3/uL (ref 3.6–11.0)

## 2018-03-09 MED ORDER — IOPAMIDOL (ISOVUE-300) INJECTION 61%: 100.0000 mL | Freq: Once | INTRAVENOUS | Status: DC | PRN

## 2018-03-09 MED ORDER — IOPAMIDOL (ISOVUE-300) INJECTION 61%
50.0000 mL | Freq: Once | INTRAVENOUS | Status: AC | PRN
  Administered 2018-03-09: 100 mL via INTRAVENOUS

## 2018-03-09 NOTE — Patient Instructions (Signed)
CT Scan A CT scan is a kind of X-ray. A CT scan makes pictures of the inside of your body. In this procedure, the pictures will be taken in a large machine that has an opening (CT scanner). What happens before the procedure? Staying hydrated Follow instructions from your doctor about hydration, which may include:  Up to 2 hours before the procedure - you may continue to drink clear liquids. These include water, clear fruit juice, black coffee, and plain tea.  Eating and drinking restrictions Follow instructions from your doctor about eating and drinking, which may include:  24 hours before the procedure - stop drinking caffeinated drinks. These include energy drinks, tea, soda, coffee, and hot chocolate.  8 hours before the procedure - stop eating heavy meals or foods. These include meat, fried foods, or fatty foods.  6 hours before the procedure - stop eating light meals or foods. These include toast or cereal.  6 hours before the procedure - stop drinking milk or drinks that have milk in them.  2 hours before the procedure - stop drinking clear liquids.  General instructions  Take off any jewelry.  Ask your doctor about changing or stopping your normal medicines. This is important if you take diabetes medicines or blood thinners. What happens during the procedure?  You will lie on a table with your arms above your head.  An IV tube may be put into one of your veins.  Dye may be put into the IV tube. You may feel warm or have a metal taste in your mouth.  The table you will be lying on will move into the CT scanner.  You will be able to see, hear, and talk to the person who is running the machine while you are in it. Follow that person's directions.  The machine will move around you to take pictures. Do not move.  When the machine is done taking pictures, it will be turned off.  The table will be moved out of the machine.  Your IV tube will be taken out. The procedure  may vary among doctors and hospitals. What happens after the procedure?  It is up to you to get your results. Ask when your results will be ready. Summary  A CT scan is a kind of X-ray.  A CT scan makes pictures of the inside of your body.  Follow instructions from your doctor about eating and drinking before the procedure.  You will be able to see, hear, and talk to the person who is running the machine while you are in it. Follow that person's directions. This information is not intended to replace advice given to you by your health care provider. Make sure you discuss any questions you have with your health care provider. Document Released: 10/24/2008 Document Revised: 08/14/2016 Document Reviewed: 08/14/2016 Elsevier Interactive Patient Education  2017 [employer] Abdominal Pain, Adult Many things can cause belly (abdominal) pain. Most times, belly pain is not dangerous. Many cases of belly pain can be watched and treated at home. Sometimes belly pain is serious, though. Your doctor will try to find the cause of your belly pain. Follow these instructions at home:  Take over-the-counter and prescription medicines only as told by your doctor. Do not take medicines that help you poop (laxatives) unless told to by your doctor.  Drink enough fluid to keep your pee (urine) clear or pale yellow.  Watch your belly pain for any changes.  Keep all follow-up visits as told  by your doctor. This is important. Contact a doctor if:  Your belly pain changes or gets worse.  You are not hungry, or you lose weight without trying.  You are having trouble pooping (constipated) or have watery poop (diarrhea) for more than 2-3 days.  You have pain when you pee or poop.  Your belly pain wakes you up at night.  Your pain gets worse with meals, after eating, or with certain foods.  You are throwing up and cannot keep anything down.  You have a fever. Get help right away if:  Your pain does  not go away as soon as your doctor says it should.  You cannot stop throwing up.  Your pain is only in areas of your belly, such as the right side or the left lower part of the belly.  You have bloody or black poop, or poop that looks like tar.  You have very bad pain, cramping, or bloating in your belly.  You have signs of not having enough fluid or water in your body (dehydration), such as: ? Dark pee, very little pee, or no pee. ? Cracked lips. ? Dry mouth. ? Sunken eyes. ? Sleepiness. ? Weakness. This information is not intended to replace advice given to you by your health care provider. Make sure you discuss any questions you have with your health care provider. Document Released: 01/14/2008 Document Revised: 02/15/2016 Document Reviewed: 01/09/2016 Elsevier Interactive Patient Education  2018 [employer]

## 2018-03-09 NOTE — Telephone Encounter (Addendum)
Called patient 03/09/18 2:42pm with normal CBC and CMET as below.  Reviewed CT Abdomen as below reviewed with patient as results are below and  discussed all findings and possible etiologies/differentials and need to  Keep gynecology appointment 03/15/18 for pelvic exam/ pap smear and evaluation for further work up. Also advised to call her Dion Body, MD for a appointment to review CT scan and hepatobiliary findings for and for  follow up and further evaluation since she was seen in this clinic for acute care. She verbalizes understanding and verbalizes she will schedule a appointment with her primary care MD and keep already scheduled gynecology appointment.   Advised patient call the office or your primary care doctor for an appointment if no improvement within 72 hours or if any symptoms change or worsen at any time  Advised ER or urgent Care if after hours or on weekend. Call 911 for emergency symptoms at any time.Patinet verbalized understanding of all instructions given/reviewed and treatment plan and has no further questions or concerns at this time.   Patient verbalized understanding of all instructions given and denies any further questions at this time.    CLINICAL DATA:  Right lower quadrant pain that radiates to the left for 2 weeks. Bilateral back pain. No known injury.  EXAM: CT ABDOMEN AND PELVIS WITH CONTRAST  TECHNIQUE: Multidetector CT imaging of the abdomen and pelvis was performed using the standard protocol following bolus administration of intravenous contrast.  CONTRAST:  134m ISOVUE-300 IOPAMIDOL (ISOVUE-300) INJECTION 61%  COMPARISON:  None.  FINDINGS: Lower chest: No acute abnormality.  Hepatobiliary: There is hepatic steatosis identified. The gallbladder is normal. The portal vein is unremarkable. Subtle increased oval attenuation left hepatic lobe on series 2, image 15 and coronal image 31 is likely a perfusion all variant or some focal fatty  sparing. A discrete mass is not visualized. No focal mass seen in the liver.  Pancreas: Unremarkable. No pancreatic ductal dilatation or surrounding inflammatory changes.  Spleen: Normal in size without focal abnormality.  Adrenals/Urinary Tract: The adrenal glands are normal. There is an extrarenal pelvis on the right. No hydronephrosis or perinephric stranding. No ureterectasis, ureteral stones, or bladder abnormalities.  Stomach/Bowel: The stomach and small bowel are normal. The colon is normal. The appendix is normal.  Vascular/Lymphatic: No significant vascular findings are present. No enlarged abdominal or pelvic lymph nodes.  Reproductive: Uterus and right ovary are normal. There is a corpus luteum cyst in the left ovary.  Other: No free air or free fluid.  Musculoskeletal: No acute or significant osseous findings.  IMPRESSION: 1. There is a corpus luteum cyst in the left ovary. 2. No other potential cause for the patient's pain identified.   Electronically Signed   By: DDorise BullionIII M.D   On: 03/09/2018 13:22  Recent Results (from the past 2160 hour(s))  POCT urinalysis dipstick     Status: Abnormal   Collection Time: 03/09/18  9:36 AM  Result Value Ref Range   Color, UA straw    Clarity, UA clear    Glucose, UA Negative Negative   Bilirubin, UA neg    Ketones, UA neg    Spec Grav, UA <=1.005 (A) 1.010 - 1.025   Blood, UA neg    pH, UA 7.5 5.0 - 8.0   Protein, UA Negative Negative   Urobilinogen, UA 0.2 0.2 or 1.0 E.U./dL   Nitrite, UA neg    Leukocytes, UA Negative Negative   Appearance     Odor  POCT urine pregnancy     Status: Normal   Collection Time: 03/09/18  9:36 AM  Result Value Ref Range   Preg Test, Ur Negative Negative  Comprehensive metabolic panel     Status: None   Collection Time: 03/09/18 11:28 AM  Result Value Ref Range   Sodium 138 135 - 145 mmol/L   Potassium 4.1 3.5 - 5.1 mmol/L   Chloride 103 98 - 111 mmol/L    CO2 27 22 - 32 mmol/L   Glucose, Bld 93 70 - 99 mg/dL   BUN 14 6 - 20 mg/dL   Creatinine, Ser 0.72 0.44 - 1.00 mg/dL   Calcium 8.9 8.9 - 10.3 mg/dL   Total Protein 7.8 6.5 - 8.1 g/dL   Albumin 4.6 3.5 - 5.0 g/dL   AST 20 15 - 41 U/L   ALT 18 0 - 44 U/L   Alkaline Phosphatase 51 38 - 126 U/L   Total Bilirubin 0.5 0.3 - 1.2 mg/dL   GFR calc non Af Amer >60 >60 mL/min   GFR calc Af Amer >60 >60 mL/min    Comment: (NOTE) The eGFR has been calculated using the CKD EPI equation. This calculation has not been validated in all clinical situations. eGFR's persistently <60 mL/min signify possible Chronic Kidney Disease.    Anion gap 8 5 - 15    Comment: Performed at [facility], [address], [place], [place] [address]  CBC with Differential/Platelet     Status: None   Collection Time: 03/09/18 11:28 AM  Result Value Ref Range   WBC 8.1 3.6 - 11.0 K/uL   RBC 4.53 3.80 - 5.20 MIL/uL   Hemoglobin 14.3 12.0 - 16.0 g/dL   HCT 41.9 35.0 - 47.0 %   MCV 92.6 80.0 - 100.0 fL   MCH 31.7 26.0 - 34.0 pg   MCHC 34.2 32.0 - 36.0 g/dL   RDW 12.7 11.5 - 14.5 %   Platelets 243 150 - 440 K/uL   Neutrophils Relative % 70 %   Neutro Abs 5.6 1.4 - 6.5 K/uL   Lymphocytes Relative 24 %   Lymphs Abs 1.9 1.0 - 3.6 K/uL   Monocytes Relative 5 %   Monocytes Absolute 0.4 0.2 - 0.9 K/uL   Eosinophils Relative 0 %   Eosinophils Absolute 0.0 0 - 0.7 K/uL   Basophils Relative 1 %   Basophils Absolute 0.1 0 - 0.1 K/uL    Comment: Performed at [facility], [address], [place],  [address]   Provider thoroughly discussed in collaboration above plan with supervising physician Dr. Miguel Aschoff who is in agreement with the care plan as above.

## 2018-03-09 NOTE — Progress Notes (Addendum)
Subjective:     Patient ID: Danielle Burton, female   DOB: 1995-08-23, 22 y.o.   MRN: [id number]   HPI   Blood pressure (!) 136/92, pulse 79, temperature 99.1 F (37.3 C), temperature source Tympanic, resp. rate 16, weight 148 lb 9.6 oz (67.4 kg), SpO2 100 %.  Patient is a 22 year old female who comes to the clinic with complaints of right lower quadrant pain for the past two weeks. She reports the last two days this pain has increased. Rates pain 7/10 pain. Pain was intermittent and is now consistent at this point last two days she reports. She reports this pain has not limited her daily activity.  Ate light breakfast ' early this morning "  Denies any nausea, vomitting,  Denies any new sexual partners.  Patient  denies any fever though tympanic tempearture 99.1 in office today, body aches,chills, rash, chest pain, shortness of breath, nausea, vomiting, or diarrhea.  Denies any body aches.   She reports she was in emergency room  April 2019 for alcohol intoxication and had positive tricyclics and opiates urine drug screen at time of emergency Roo[place] visit and Narcan was administered. She reports mariajuana .Denies any other alcohol use.   Appetite " normal "for her she reports she never really has a great appetite.  She reports she discontinued her Lexapro on her own " weeks ago". She denies any homicidal or suicidal thoughts or plans.  She also smokes cigarettes three per day.  Denies any previous surgeries.  Denies any chance of pregnancy- she is in a lesbian relationship she reports.  LMP June 2019-unsure of date.   Dion Body, MD- seen PCP  last in March 2019.   She has an appointment for pelvic exam/ pap smear on Monday 03/15/18- she has not had a pelvic exam before.She does not know what office she is seeing for this appointment states " I think somewhere at the hospital".     Review of Systems  Constitutional: Positive for fever. Negative for activity change, appetite  change, chills, diaphoresis, fatigue and unexpected weight change.  HENT: Negative.   Eyes: Negative.   Respiratory: Negative.   Cardiovascular: Negative.   Gastrointestinal: Positive for abdominal pain. Negative for abdominal distention, anal bleeding, blood in stool, constipation, diarrhea, nausea, rectal pain and vomiting.  Genitourinary: Negative.   Musculoskeletal: Negative.   Skin: Negative.   Allergic/Immunologic: Negative for environmental allergies, food allergies and immunocompromised state.        -- Penicillins -- Rash   Neurological: Negative.   Hematological: Negative.   Psychiatric/Behavioral: Negative.        Objective:   Physical Exam  Constitutional: She appears well-developed and well-nourished.  Non-toxic appearance. She does not appear ill. No distress.  Patient is alert and oriented and responsive to questions though a poor historian at times.   Engages in eye contact with provider. Speaks in full sentences without any pauses without any shortness of breath or distress.    HENT:  Head: Normocephalic and atraumatic.  Mouth/Throat: Oropharynx is clear and moist. No oropharyngeal exudate.  Eyes: Pupils are equal, round, and reactive to light. EOM are normal.  Cardiovascular: Normal rate, regular rhythm, normal heart sounds and intact distal pulses. Exam reveals no gallop and no friction rub.  No murmur heard. Pulmonary/Chest: Effort normal and breath sounds normal. No stridor. No respiratory distress. She has no wheezes. She has no rhonchi. She has no rales. She exhibits no tenderness.  Abdominal: Soft. Normal appearance,  normal aorta and bowel sounds are normal. She exhibits no shifting dullness, no distension, no pulsatile liver, no fluid wave, no abdominal bruit, no ascites, no pulsatile midline mass and no mass. There is no hepatosplenomegaly, splenomegaly or hepatomegaly. There is tenderness (+ Psoas sign ( right) ) in the right lower quadrant. There is no  rigidity, no rebound, no guarding, no CVA tenderness, no tenderness at McBurney's point and negative Murphy's sign. No hernia.  Genitourinary:  Genitourinary Comments: No gynecology exams done in this office currently and no equipment available. Patient is aware she will have to see gynecology if needed for any pelvic/vaginal exam and will follow up with gynecology/obgyn as needed. Yearly gynecology pelvic exam recommended. Patient verbalized understanding of instructions and denies any further questions at this time.    Musculoskeletal:  Patient moves on and off of exam table and in room without difficulty though slightly slower movement getting off exam table.   Gait is normal in hall and in room. Patient is oriented to person place time and situation. Patient answers questions appropriately and engages in conversation.    Lymphadenopathy:       Head (right side): No submental, no submandibular, no tonsillar, no preauricular, no posterior auricular and no occipital adenopathy present.       Head (left side): No submental, no submandibular, no tonsillar, no preauricular, no posterior auricular and no occipital adenopathy present.    She has no cervical adenopathy.       Right: No inguinal adenopathy present.       Left: No inguinal adenopathy present.  Neurological: She is alert. She has normal strength. She displays a negative Romberg sign.  Skin: Skin is warm, dry and intact. Capillary refill takes less than 2 seconds. No rash noted.  Psychiatric: She has a normal mood and affect. Her speech is normal and behavior is normal. Judgment and thought content normal. Cognition and memory are normal.         Results for orders placed or performed in visit on 03/09/18 (from the past 24 hour(s))  POCT urinalysis dipstick     Status: Abnormal   Collection Time: 03/09/18  9:36 AM  Result Value Ref Range   Color, UA straw    Clarity, UA clear    Glucose, UA Negative Negative   Bilirubin, UA neg     Ketones, UA neg    Spec Grav, UA <=1.005 (A) 1.010 - 1.025   Blood, UA neg    pH, UA 7.5 5.0 - 8.0   Protein, UA Negative Negative   Urobilinogen, UA 0.2 0.2 or 1.0 E.U./dL   Nitrite, UA neg    Leukocytes, UA Negative Negative   Appearance     Odor    POCT urine pregnancy     Status: Normal   Collection Time: 03/09/18  9:36 AM  Result Value Ref Range   Preg Test, Ur Negative Negative    Assessment:     Right lower quadrant abdominal pain - Plan: POCT urinalysis dipstick, POCT urine pregnancy, CT Abdomen Pelvis W Contrast, CBC with Differential/Platelet, Comp Met (CMET)      Plan:     Orders Placed This Encounter  Procedures  . CT Abdomen Pelvis W Contrast    Patient to wait until report called    Order Specific Question:   ** REASON FOR EXAM (FREE TEXT)    Answer:   right lower quadrant pain rule out acute etiology and positive psoas sign in office/ ovarian ?  Order Specific Question:   If indicated for the ordered procedure, I authorize the administration of contrast media per Radiology protocol    Answer:   Yes    Order Specific Question:   Is patient pregnant?    Answer:   No    Comments:   //POCT urine negative7/30/19     Order Specific Question:   Preferred imaging location?    Answer:   [facility]    Order Specific Question:   Is Oral Contrast requested for this exam?    Answer:   Yes, Per Radiology protocol    Order Specific Question:   Call Results- Best Contact Number?    Answer:   [phone]    Order Specific Question:   Radiology Contrast Protocol - do NOT remove file path    Answer:   [email or web address]  . CBC with Differential/Platelet  . Comp Met (CMET)    Order Specific Question:   Has the patient fasted?    Answer:   No  . POCT urinalysis dipstick  . POCT urine pregnancy  Discussed possible differentials appendicitis. Pelvic Inflammatory Disease, ovarian cyst, hernia, constipation and other differentials possible.    Will call with results from orders when available.   Recommend keeping your gynecology office visit for next Monday. Advised Emergency Room and calling 911 if needed for worsening  symptoms / emergent symptoms as discussed.  Provider thoroughly discussed in collaboration above plan with supervising physician Dr. Miguel Aschoff who is in agreement with the care plan as above.   Advised patient call the office or your primary care doctor for an appointment if no improvement within 72 hours or if any symptoms change or worsen at any time  Advised ER or urgent Care if after hours or on weekend. Call 911 for emergency symptoms at any time.Patinet verbalized understanding of all instructions given/reviewed and treatment plan and has no further questions or concerns at this time.

## 2018-03-09 NOTE — Progress Notes (Signed)
Labs CBC and CMET within normal limits.

## 2018-04-28 ENCOUNTER — Other Ambulatory Visit: Payer: Self-pay

## 2018-04-28 ENCOUNTER — Ambulatory Visit: Payer: Commercial Managed Care - PPO | Attending: Obstetrics and Gynecology

## 2018-04-28 DIAGNOSIS — M533 Sacrococcygeal disorders, not elsewhere classified: Secondary | ICD-10-CM | POA: Insufficient documentation

## 2018-04-28 DIAGNOSIS — R293 Abnormal posture: Secondary | ICD-10-CM | POA: Diagnosis present

## 2018-04-28 DIAGNOSIS — M62838 Other muscle spasm: Secondary | ICD-10-CM | POA: Diagnosis present

## 2018-04-28 NOTE — Therapy (Signed)
[place] MAIN [facility] SERVICES [address] [place], [place], [address] Phone: [phone]   Fax:  [phone]  Physical Therapy Evaluation  Patient Details  Name: Danielle Burton MRN: [id number] Date of Birth: 05/21/1996 Referring Provider: Hassan Buckler    Encounter Date: 04/28/2018  PT End of Session - 04/29/18 1626    Visit Number  1    Number of Visits  8    Date for PT Re-Evaluation  06/24/18    PT Start Time  4462    PT Stop Time  1630    PT Time Calculation (min)  60 min    Activity Tolerance  Patient tolerated treatment well    Behavior During Therapy  [facility] for tasks assessed/performed       History reviewed. No pertinent past medical history.  History reviewed. No pertinent surgical history.  There were no vitals filed for this visit.        Pelvic Floor Physical Therapy Evaluation and Assessment  SCREENING  Falls in last 6 mo: no  Patient's communication preference:   Red Flags:  Have you had any night sweats? no Unexplained weight loss? no Saddle anesthesia? Some tingling in pain distribution Unexplained changes in bowel or bladder habits? no  SUBJECTIVE   Patient reports: Was using for ~ 2 years and then has been sober for ~ 6 months. Has been having pain for ~5-6 months. Was on benzodiazepines.  Has pain in R lower quadrant and groin. It hurts all the time but pressure makes it worse. Can some time cramp up really hard and make it hard to move when driving, no pin killer helps.  Precautions:  Alcohol and drug abuse, anxiety   Social/Family/Vocational History:   Works full time for [employer], cleaning  Recent Procedures/Tests/Findings:  CT Abdomen and Pelvis, no significant finding other than small cyst on L ovary  Obstetrical History: none  Gynecological History: none  Urinary History: none  Gastrointestinal History: Once per day, not straining  Sexual activity/pain: No pain with  intercourse, not in relationship, lesbian   Location of pain: R lower quadrant Current pain:  7/10  Max pain:  10+/10 Least pain:  4/10 Nature of pain: crampy  Patient Goals: Not to have pain.   OBJECTIVE  Posture/Observations:  Sitting: sits in forward slump, moves anxiously Standing: no detectable mal-alignment except mild ASIS low Supine: R ASIS is low  Palpation/Segmental Motion/Joint Play: Decreased mobility and pain with motion on L>R sacral border. TTP through L QL and Paraspinals.  Special tests:   Scoliosis: negative Leg-length: negative Supine to sit: negative.  Range of Motion/Flexibilty:  Spine: not assessed Hips:  decreased hip extension   Strength/MMT: not assessed LE MMT  LE MMT Left Right  Hip flex:  (L2) /5 /5  Hip ext: /5 /5  Hip abd: /5 /5  Hip add: /5 /5  Hip IR /5 /5  Hip ER /5 /5     Abdominal:  Palpation: TTP through R Psoas and Iliacus as well as vastus lateralis Diastasis: none  Pelvic Floor External Exam: Deferred indefinately, no indication Introitus Appears:  Skin integrity:  Palpation: Cough: Prolapse visible?: Scar mobility:  Internal Vaginal Exam: Deferred indefinately, no indication Strength (PERF):  Symmetry: Palpation: Prolapse:   Internal Rectal Exam: Deferred indefinately, no indication Strength (PERF): Symmetry: Palpation: Prolapse:   Gait Analysis: not assessed  INTERVENTIONS THIS SESSION: Self-care: Educated on the structure and function of the pelvic floor in relation to their symptoms as  well as the POC, and initial HEP in order to set patient expectations and understanding from which we will build on in the future sessions. Manual: Performed TP release to R Psoas and Iliacus and L QL and lumbar paraspinals as well as PA mobs to L sacral border and MET correction for R anterior rotation to decrease malalignment and decrease pain. Therex: Educated on how to perform home MET correction for R anterior  rotation, L side-stretch, and R hip-flexor stretch to decrease pain and spasm and allow for improved balance of musculature surrounding the pelvis.  Total time: 60 min.        Objective measurements completed on examination: See above findings.              PT Education - 04/29/18 1626    Education Details  See Pt. Instructions and Interventions this session.    Person(s) Educated  Patient    Methods  Explanation    Comprehension  Verbalized understanding;Returned demonstration;Verbal cues required       PT Short Term Goals - 04/30/18 0841      PT SHORT TERM GOAL #1   Title  Patient will report a reduction in pain to no greater than 5/10 over the prior week to demonstrate symptom improvement.    Baseline  10/10 max    Time  4    Period  Weeks    Status  New    Target Date  05/28/18      PT SHORT TERM GOAL #2   Title  Patient will demonstrate HEP x1 in the clinic to demonstrate understanding and proper form to allow for further improvement.    Time  4    Period  Weeks    Status  New    Target Date  05/28/18        PT Long Term Goals - 04/30/18 9675      PT LONG TERM GOAL #1   Title  Patient will describe pain no greater than 1/10 during ADLs and Work-related activity to demonstrate improved functional ability.    Baseline  max 10/10 pain    Time  8    Period  Weeks    Status  New    Target Date  06/25/18      PT LONG TERM GOAL #2   Title  Patient will demonstrate improved sitting and standing posture to demonstrate learning and decrease stress on the pelvic floor with functional activity.    Baseline  Anterior pelvic tilt and forward rolled shoulders    Time  8    Period  Weeks    Status  New    Target Date  06/25/18      PT LONG TERM GOAL #3   Title  Patient will demonstrate improved pelvic alignment and balance of musculature surrounding the pelvis to facilitate continued prevention of spasms and decreased pelvic pain.    Baseline  R andterior/L  posterior rotation and spasms surrounding pelvis.    Time  8    Period  Weeks    Status  New    Target Date  06/25/18             Plan - 04/29/18 1627    Clinical Impression Statement  Pt. is a 22 y/o female who presents today with RLQ and anterior hip pain that began about 6 months prior. Her relevant PMH includes drug and alcohol abuse and anxiety. Her Clnical exam revealed minor R anterior rotation of innominate and  significant spasm in R>L hip flexors and L>R extensors as well as decreased sacral mobility which are contributing to this mal-alignment. She will benefit from further skilled PT to continue to decrease mal-alignment and spasm and prevent recurrence through development of an HEP to balance musculature acting on pelvis. She demonstrated a within-session decrease in pan from 7/10 to 3/10.     Clinical Presentation  Stable    Clinical Decision Making  Low    Rehab Potential  Good    PT Frequency  1x / week    PT Duration  8 weeks    PT Treatment/Interventions  ADLs/Self Care Home Management;Electrical Stimulation;Traction;Moist Heat;Functional mobility training;Therapeutic activities;Therapeutic exercise;Patient/family education;Neuromuscular re-education;Manual techniques;Dry needling;Spinal Manipulations;Joint Manipulations    PT Next Visit Plan  Further manual surrounding the pelvis for R anterior rotation and Strengthening to decrease imbalance surrounding pelvis    PT Home Exercise Plan  MET correction for R anterior rotation, R hip-flexor stretch and L side-stretch.    Consulted and Agree with Plan of Care  Patient       Patient will benefit from skilled therapeutic intervention in order to improve the following deficits and impairments:     Visit Diagnosis: Other muscle spasm  Posture abnormality  Sacrococcygeal disorders, not elsewhere classified     Problem List Patient Active Problem List   Diagnosis Date Noted  . Alcohol abuse 12/08/2017  . Substance  induced mood disorder ([place]) 12/08/2017  . Benzodiazepine abuse ([place]) 12/08/2017  . GAD (generalized anxiety disorder) 10/20/2017  . Moderate episode of recurrent major depressive disorder ([place]) 10/20/2017   Willa Rough DPT, ATC Willa Rough 04/30/2018, 9:00 AM  [place] MAIN [facility] SERVICES [address] [place], [place], [address] Phone: [phone]   Fax:  [phone]  Name: MINOLA GUIN MRN: [id number] Date of Birth: February 01, 1996

## 2018-04-28 NOTE — Patient Instructions (Signed)
Flexors, Lunge  Hip Flexor Stretch: Proposal Pose    Maintain pelvic tuck under, lift pubic bone toward navel. Engage posterior hip muscles (firm glute muscles of leg in back position) and shift forward until you feel stretch on front of leg that is down. To increase stretch, maintain balance and ease hips forward. You may use one hand on a chair for balance if needed. Hold for __5__ breaths. Repeat __2-3__ times each leg.  Do _1-2__ times per day.   Pelvic Rotation: Contract / Relax (Supine)    With knees bent over bolster, right knee crossed over other, press thighs together tightly without allowing movement. Hold __5__ seconds. Relax. Repeat __5__ times per set. Do __1-2__ sets per session. Do __1__ sessions per day.   Pelvic Rotation: Contract / Relax (Supine)  MET to Correct Right Anteriorly Rotated/Left Posteriorly Rotated Innominate   Begin laying on your back with your feet at 90 degrees. Put a dowel/broomstick  through your legs, behind your right knee and in front of your left knee. Stabilize the dowel on ether side with your hands.  Press down with the right leg and up with the left leg. Hold for 5 seconds  then slowly relax. Repeat 5 times.    

## 2018-05-05 ENCOUNTER — Ambulatory Visit: Payer: Commercial Managed Care - PPO

## 2018-05-05 DIAGNOSIS — M62838 Other muscle spasm: Secondary | ICD-10-CM | POA: Diagnosis not present

## 2018-05-05 DIAGNOSIS — R293 Abnormal posture: Secondary | ICD-10-CM

## 2018-05-05 DIAGNOSIS — M533 Sacrococcygeal disorders, not elsewhere classified: Secondary | ICD-10-CM

## 2018-05-05 NOTE — Therapy (Signed)
[place] MAIN [facility] SERVICES [address] [place], [place], [address] Phone: [phone]   Fax:  [phone]  Physical Therapy Treatment  Patient Details  Name: Danielle Burton MRN: [id number] Date of Birth: [birth year] Referring Provider: Hassan Buckler    Encounter Date: 05/05/2018  PT End of Session - 05/05/18 1810    Visit Number  2    Number of Visits  8    Date for PT Re-Evaluation  06/24/18    PT Start Time  0630    PT Stop Time  1630    PT Time Calculation (min)  60 min    Activity Tolerance  Patient tolerated treatment well    Behavior During Therapy  [facility] for tasks assessed/performed       No past medical history on file.  No past surgical history on file.  There were no vitals filed for this visit.    Pelvic Floor Physical Therapy Treatment Note  SCREENING  Changes in medications, allergies, or medical history?: Started taking medicine for her stomach lining where ulcers were found. Nexium and something else.    SUBJECTIVE  Patient reports: She is still sore but only in the R lower abdomen. Has done her exercises about three times over the past week. It feels a little better. Has increased her weight-lifting doing overhead press and bench press and abdominal exercises. Crunches, leg-lifts and elbow-to-knee crunches.  Pain update:  Location of pain: R abdomen Current pain:  4/10  Max pain:  8/10 Least pain:  4/10 Nature of pain: sore  Patient Goals: Not to have pain.   OBJECTIVE  Changes in: Posture/Observations:  Mildly hyperlordotic/kyphotic but improved pelvic alignment.  Range of Motion/Flexibilty:  Decreased mobility and pain with pressure through R sacral border, lumbar and lower thoracic spine.  Palpation: TTP to to R lumbar paraspinals, multifidus, Iliacus, Psoas, and QL   INTERVENTIONS THIS SESSION: Manual: Performed STM and TP releaese to R lumbar paraspinals, Iliacus, Psoas and QL to  decrease spasm and pain and allow for improved posture to prevent return of pain. Performed grade 3-4 PA mobs to R sacral border and base as well as lumbar and lower thoracic spinous processes to decrease stiffness and pain and allow for decreased tension on nerve roots as they course toward the abdomen and hip.  Dry needle: Performed standard approach TPDN with a .30x60 and .30c88m needle to R lumbar paraspinals, multifidus, Iliacus, and QL to decrease spasm and pain and allow for improved posture to prevent return of pain. Therex: educated on and practiced thoracic extensions over a towel roll to continue to improve mobility of the upper spine to allow for improved posture and decreased hip-flexor and back spasms from hyperlordotic/kyphotic curves.  Total time: 60 min.                          PT Education - 05/05/18 1809    Education Details  See Pt. Instructions and Interventions this session.    Person(s) Educated  Patient    Methods  Explanation;Demonstration;Verbal cues;Handout    Comprehension  Verbalized understanding;Returned demonstration;Verbal cues required       PT Short Term Goals - 04/30/18 0841      PT SHORT TERM GOAL #1   Title  Patient will report a reduction in pain to no greater than 5/10 over the prior week to demonstrate symptom improvement.    Baseline  10/10 max    Time  4    Period  Weeks    Status  New    Target Date  05/28/18      PT SHORT TERM GOAL #2   Title  Patient will demonstrate HEP x1 in the clinic to demonstrate understanding and proper form to allow for further improvement.    Time  4    Period  Weeks    Status  New    Target Date  05/28/18        PT Long Term Goals - 04/30/18 1572      PT LONG TERM GOAL #1   Title  Patient will describe pain no greater than 1/10 during ADLs and Work-related activity to demonstrate improved functional ability.    Baseline  max 10/10 pain    Time  8    Period  Weeks    Status  New     Target Date  06/25/18      PT LONG TERM GOAL #2   Title  Patient will demonstrate improved sitting and standing posture to demonstrate learning and decrease stress on the pelvic floor with functional activity.    Baseline  Anterior pelvic tilt and forward rolled shoulders    Time  8    Period  Weeks    Status  New    Target Date  06/25/18      PT LONG TERM GOAL #3   Title  Patient will demonstrate improved pelvic alignment and balance of musculature surrounding the pelvis to facilitate continued prevention of spasms and decreased pelvic pain.    Baseline  R andterior/L posterior rotation and spasms surrounding pelvis.    Time  8    Period  Weeks    Status  New    Target Date  06/25/18            Plan - 05/05/18 1810    Clinical Impression Statement  Pt. responded well to all interventions today, demonstrating decreased tenderness and spasm as well as improved mobility and understanding of all education provided. Continue per POC.    Clinical Presentation  Stable    Clinical Decision Making  Low    Rehab Potential  Good    PT Frequency  1x / week    PT Duration  8 weeks    PT Treatment/Interventions  ADLs/Self Care Home Management;Electrical Stimulation;Traction;Moist Heat;Functional mobility training;Therapeutic activities;Therapeutic exercise;Patient/family education;Neuromuscular re-education;Manual techniques;Dry needling;Spinal Manipulations;Joint Manipulations    PT Next Visit Plan  Deep-core strengthening and how to make sure she is using deep core when doing her abdominal exercises. further DN to R Psoas and quad/pectineus PRN.    PT Home Exercise Plan  MET correction for R anterior rotation, R hip-flexor stretch and L side-stretch, Thoracic extensions over towel roll.    Consulted and Agree with Plan of Care  Patient       Patient will benefit from skilled therapeutic intervention in order to improve the following deficits and impairments:  Improper body mechanics,  Pain, Decreased mobility, Increased muscle spasms, Postural dysfunction, Decreased range of motion  Visit Diagnosis: Other muscle spasm  Posture abnormality  Sacrococcygeal disorders, not elsewhere classified     Problem List Patient Active Problem List   Diagnosis Date Noted  . Alcohol abuse 12/08/2017  . Substance induced mood disorder ([place]) 12/08/2017  . Benzodiazepine abuse ([place]) 12/08/2017  . GAD (generalized anxiety disorder) 10/20/2017  . Moderate episode of recurrent major depressive disorder ([place]) 10/20/2017   Willa Rough DPT, ATC Willa Rough  05/05/2018, 6:12 PM  [place] MAIN [facility] SERVICES [address] [place], [place], [address] Phone: [phone]   Fax:  [phone]  Name: CORENE RESNICK MRN: [id number] Date of Birth: [birth year]

## 2018-05-05 NOTE — Patient Instructions (Signed)
   Place foam roller or towel under your upper back between your shoulder blades. Support your head with your hands, elbows forward, and gently rock back and forth and side to side to improve motion in your back.   Move the foam roller or towel up and down to a few spots in the upper back, repeating the process.    

## 2018-05-12 ENCOUNTER — Ambulatory Visit: Payer: Commercial Managed Care - PPO | Attending: Obstetrics and Gynecology

## 2018-05-12 DIAGNOSIS — M62838 Other muscle spasm: Secondary | ICD-10-CM | POA: Insufficient documentation

## 2018-05-12 DIAGNOSIS — M533 Sacrococcygeal disorders, not elsewhere classified: Secondary | ICD-10-CM | POA: Diagnosis present

## 2018-05-12 DIAGNOSIS — R293 Abnormal posture: Secondary | ICD-10-CM | POA: Insufficient documentation

## 2018-05-12 NOTE — Therapy (Signed)
[place] MAIN [facility] SERVICES [address] [place], [place], [address] Phone: [phone]   Fax:  [phone]  Physical Therapy Treatment  Patient Details  Name: Danielle Burton MRN: [id number] Date of Birth: Dec 23, 1995 Referring Provider (PT): Hassan Buckler    Encounter Date: 05/12/2018  PT End of Session - 05/14/18 2148    Visit Number  3    Number of Visits  8    Date for PT Re-Evaluation  06/24/18    PT Start Time  1696    PT Stop Time  1630    PT Time Calculation (min)  60 min    Activity Tolerance  Patient tolerated treatment well    Behavior During Therapy  [facility] for tasks assessed/performed       No past medical history on file.  No past surgical history on file.  There were no vitals filed for this visit.    Pelvic Floor Physical Therapy Treatment Note  SCREENING  Changes in medications, allergies, or medical history?: no    SUBJECTIVE  Patient reports: She has not been having any pain in the hip but still has pain in the lower abdomen on the R.  Precautions:  History of Opiod addiction  Pain update:  Location of pain: R lower abdomen Current pain:  4/10  Max pain:  4/10 Least pain:  4/10 Nature of pain: sharp  Patient Goals: Not to have pain.    OBJECTIVE  Changes in: Posture/Observations:  Hyperkyphosis/lordosis  Range of Motion/Flexibilty:  Decreased hip and shoulder extension, decreased thoracic mobility   Palpation: TTP to B Psoas and R pectineus.  INTERVENTIONS THIS SESSION: Manual: performed STM and TP release to B Psoas and R pectineus to decrease spasm and pain and allow for improved hip extension to facilitate improved posture for prevention of pain return. Dry needle: performed TPDN with standard approach and .30x31m needle to B Psoas and R pectineus to decrease spasm and pain and allow for improved hip extension to facilitate improved posture for prevention of pain  return. Therex: Educated on doorway chest stretch to help decrease kyphotic curve and improve overall posture to prevent return of spasms and pain. Educated on and practiced mini-marches to increase deep-core strength and take pressure off of lumbar nerve roots for decreased spasms and pain.  Total time: 60 min.                    Trigger Point Dry Needling - 05/14/18 2141    Consent Given?  Yes    Education Handout Provided  No    Muscles Treated Upper Body  Iliopsoas           PT Education - 05/14/18 2147    Education Details  See Pt. Instructions and Interventions this session.    Person(s) Educated  Patient    Methods  Explanation;Demonstration;Verbal cues;Handout;Tactile cues    Comprehension  Verbalized understanding;Returned demonstration;Verbal cues required;Tactile cues required       PT Short Term Goals - 04/30/18 0841      PT SHORT TERM GOAL #1   Title  Patient will report a reduction in pain to no greater than 5/10 over the prior week to demonstrate symptom improvement.    Baseline  10/10 max    Time  4    Period  Weeks    Status  New    Target Date  05/28/18      PT SHORT TERM GOAL #2   Title  Patient will demonstrate HEP x1 in the clinic to demonstrate understanding and proper form to allow for further improvement.    Time  4    Period  Weeks    Status  New    Target Date  05/28/18        PT Long Term Goals - 04/30/18 9030      PT LONG TERM GOAL #1   Title  Patient will describe pain no greater than 1/10 during ADLs and Work-related activity to demonstrate improved functional ability.    Baseline  max 10/10 pain    Time  8    Period  Weeks    Status  New    Target Date  06/25/18      PT LONG TERM GOAL #2   Title  Patient will demonstrate improved sitting and standing posture to demonstrate learning and decrease stress on the pelvic floor with functional activity.    Baseline  Anterior pelvic tilt and forward rolled shoulders     Time  8    Period  Weeks    Status  New    Target Date  06/25/18      PT LONG TERM GOAL #3   Title  Patient will demonstrate improved pelvic alignment and balance of musculature surrounding the pelvis to facilitate continued prevention of spasms and decreased pelvic pain.    Baseline  R andterior/L posterior rotation and spasms surrounding pelvis.    Time  8    Period  Weeks    Status  New    Target Date  06/25/18            Plan - 05/14/18 2148    Clinical Impression Statement  Pt. responded well to all interventions today, demonstrating decreased pain and spasm and improved hip extension ROM following treatment as well as understanding of and appropriate performance of all education and exercises provided. Continue per POC.    Clinical Presentation  Stable    Clinical Decision Making  Low    Rehab Potential  Good    PT Frequency  1x / week    PT Duration  8 weeks    PT Treatment/Interventions  ADLs/Self Care Home Management;Electrical Stimulation;Traction;Moist Heat;Functional mobility training;Therapeutic activities;Therapeutic exercise;Patient/family education;Neuromuscular re-education;Manual techniques;Dry needling;Spinal Manipulations;Joint Manipulations    PT Next Visit Plan  review deep-core strengthening, PA to thoracic spine, DN to pecotal muscles PRN    PT Home Exercise Plan  MET correction for R anterior rotation, R hip-flexor stretch and L side-stretch, Thoracic extensions over towel roll.    Consulted and Agree with Plan of Care  Patient       Patient will benefit from skilled therapeutic intervention in order to improve the following deficits and impairments:  Improper body mechanics, Pain, Decreased mobility, Increased muscle spasms, Postural dysfunction, Decreased range of motion  Visit Diagnosis: Other muscle spasm  Posture abnormality  Sacrococcygeal disorders, not elsewhere classified     Problem List Patient Active Problem List   Diagnosis Date  Noted  . Alcohol abuse 12/08/2017  . Substance induced mood disorder ([place]) 12/08/2017  . Benzodiazepine abuse ([place]) 12/08/2017  . GAD (generalized anxiety disorder) 10/20/2017  . Moderate episode of recurrent major depressive disorder ([place]) 10/20/2017   Willa Rough DPT, ATC Willa Rough 05/14/2018, 9:51 PM  [place] MAIN [facility] SERVICES [address] [place], [place], [address] Phone: [phone]   Fax:  [phone]  Name: Danielle Burton MRN: [id number] Date of Birth:  11-18-1995

## 2018-05-12 NOTE — Patient Instructions (Addendum)
   Hold stretch for 5 deep breaths on each side, repeat 2-3 times, once per day.  Mini-Marches    Exhale, drawing the lower tummy (TA) in toward the back bone and hold contraction while you lift one foot ~ 2 inches off the mat, then the other foot before relaxing and resetting. Try to keep your hips from rocking, using your hands to sense whether they are staying even as pictured.      Perform _10__ repetitions for _3__ sets. Do this _1-2_ times per day.

## 2018-05-19 ENCOUNTER — Ambulatory Visit: Payer: Commercial Managed Care - PPO

## 2018-05-19 DIAGNOSIS — M62838 Other muscle spasm: Secondary | ICD-10-CM

## 2018-05-19 DIAGNOSIS — R293 Abnormal posture: Secondary | ICD-10-CM

## 2018-05-19 DIAGNOSIS — M533 Sacrococcygeal disorders, not elsewhere classified: Secondary | ICD-10-CM

## 2018-05-19 NOTE — Therapy (Signed)
[place] MAIN [facility] SERVICES [address] [place], [place], [address] Phone: [phone]   Fax:  [phone]  Physical Therapy Treatment  Patient Details  Name: Danielle Burton MRN: [id number] Date of Birth: 05/05/1996 Referring Provider (PT): Hassan Buckler    Encounter Date: 05/19/2018  PT End of Session - 05/19/18 1648    Visit Number  4    Number of Visits  8    Date for PT Re-Evaluation  06/24/18    PT Start Time  1330    PT Stop Time  1430    PT Time Calculation (min)  60 min    Activity Tolerance  Patient tolerated treatment well    Behavior During Therapy  [facility] for tasks assessed/performed       No past medical history on file.  No past surgical history on file.  There were no vitals filed for this visit.   Pelvic Floor Physical Therapy Treatment Note  SCREENING  Changes in medications, allergies, or medical history?: no    SUBJECTIVE  Patient reports: Not having pain in the R hip but is having some in her low back for last 3 days.  Precautions:  History of opioid abuse  Pain update:  Location of pain: low back Current pain:  6/10  Max pain:  6/10 Least pain:  6/10 Nature of pain: achy  *3/10 on R following treatment  Patient Goals: Not to have pain.   OBJECTIVE  Changes in: Posture/Observations:  Posterior pelvic tilt, butt gripping. Pelvis is still aligned.  Range of Motion/Flexibilty:  Decreased sacral mobility on R>L and minimally through upper lumbar spine and L-S junction (less than prior)  Palpation: TTP to B low-lumbar multifidus and through L lumbar paraspinals and QL as well as decreased fascial mobility surrounding B SIJ and through R iliac crest.   INTERVENTIONS THIS SESSION: Manual: Performed grade 3-4 PA mobs to all sacral borders and both upper lumbar and L-S junction to improve mobility and decrease pain. Performed MFR to B SIJ/upper iliac crest and to R anterior iliac crest to  decrease fascial restriction and allow for improved mobility for decreased pain. Performed TP release to  B low-lumbar multifidus and through L lumbar paraspinals and QL for decreased pain and spasm and improved balance of musculature surrounding the pelvis to prevent return of spasm and pain.  Therex: reviewed HEP and educated Pt. On how to slightly modify side-stretch to target the remaining tightness in her R QL for continued pain relief.   Total time: 60 min.                           PT Education - 05/19/18 1647    Education Details  See Pt. instructions and interventions this session    Person(s) Educated  Patient    Methods  Explanation;Demonstration    Comprehension  Verbalized understanding;Returned demonstration       PT Short Term Goals - 04/30/18 0841      PT SHORT TERM GOAL #1   Title  Patient will report a reduction in pain to no greater than 5/10 over the prior week to demonstrate symptom improvement.    Baseline  10/10 max    Time  4    Period  Weeks    Status  New    Target Date  05/28/18      PT SHORT TERM GOAL #2   Title  Patient will demonstrate HEP  x1 in the clinic to demonstrate understanding and proper form to allow for further improvement.    Time  4    Period  Weeks    Status  New    Target Date  05/28/18        PT Long Term Goals - 04/30/18 5329      PT LONG TERM GOAL #1   Title  Patient will describe pain no greater than 1/10 during ADLs and Work-related activity to demonstrate improved functional ability.    Baseline  max 10/10 pain    Time  8    Period  Weeks    Status  New    Target Date  06/25/18      PT LONG TERM GOAL #2   Title  Patient will demonstrate improved sitting and standing posture to demonstrate learning and decrease stress on the pelvic floor with functional activity.    Baseline  Anterior pelvic tilt and forward rolled shoulders    Time  8    Period  Weeks    Status  New    Target Date  06/25/18       PT LONG TERM GOAL #3   Title  Patient will demonstrate improved pelvic alignment and balance of musculature surrounding the pelvis to facilitate continued prevention of spasms and decreased pelvic pain.    Baseline  R andterior/L posterior rotation and spasms surrounding pelvis.    Time  8    Period  Weeks    Status  New    Target Date  06/25/18            Plan - 05/19/18 1648    Clinical Impression Statement  Pt. responded well to all interventions today, demonstrating a decrease in pain from 6/10 to 3/10 within the session and understanding of all educaton provided. Contiinue per POC.     Clinical Presentation  Stable    Clinical Decision Making  Low    Rehab Potential  Good    PT Frequency  1x / week    PT Duration  8 weeks    PT Treatment/Interventions  ADLs/Self Care Home Management;Electrical Stimulation;Traction;Moist Heat;Functional mobility training;Therapeutic activities;Therapeutic exercise;Patient/family education;Neuromuscular re-education;Manual techniques;Dry needling;Spinal Manipulations;Joint Manipulations    PT Next Visit Plan  review deep-core strengthening, PA to thoracic spine, DN to pectoral muscles PRN    PT Home Exercise Plan  MET correction for R anterior rotation, R hip-flexor stretch and L side-stretch, Thoracic extensions over towel roll.    Consulted and Agree with Plan of Care  Patient       Patient will benefit from skilled therapeutic intervention in order to improve the following deficits and impairments:  Improper body mechanics, Pain, Decreased mobility, Increased muscle spasms, Postural dysfunction, Decreased range of motion  Visit Diagnosis: Other muscle spasm  Posture abnormality  Sacrococcygeal disorders, not elsewhere classified     Problem List Patient Active Problem List   Diagnosis Date Noted  . Alcohol abuse 12/08/2017  . Substance induced mood disorder ([place]) 12/08/2017  . Benzodiazepine abuse ([place]) 12/08/2017  . GAD  (generalized anxiety disorder) 10/20/2017  . Moderate episode of recurrent major depressive disorder ([place]) 10/20/2017   Willa Rough DPT, ATC Willa Rough 05/19/2018, 4:50 PM  [place] MAIN [facility] SERVICES [address] [place], [place], [address] Phone: [phone]   Fax:  [phone]  Name: Danielle Burton MRN: [id number] Date of Birth: 07/07/1996

## 2018-05-26 ENCOUNTER — Ambulatory Visit: Payer: Commercial Managed Care - PPO

## 2018-06-02 ENCOUNTER — Ambulatory Visit: Payer: Commercial Managed Care - PPO

## 2018-06-02 DIAGNOSIS — M533 Sacrococcygeal disorders, not elsewhere classified: Secondary | ICD-10-CM

## 2018-06-02 DIAGNOSIS — M62838 Other muscle spasm: Secondary | ICD-10-CM

## 2018-06-02 DIAGNOSIS — R293 Abnormal posture: Secondary | ICD-10-CM

## 2018-06-02 NOTE — Patient Instructions (Addendum)
Mini-Marches    Exhale, drawing the lower tummy (TA) in toward the back bone and hold contraction while you lift one foot ~ 2 inches off the mat, then the other foot before relaxing and resetting. Try to keep your hips from rocking, using your hands to sense whether they are staying even as pictured.      Perform _10__ repetitions for _3__ sets. Do this _1_ times per day.   Bracing With Arm / Leg Raise (Quadruped)    On hands and knees find neutral spine. Tighten pelvic floor and abdominals and hold. Alternating, lift arm to shoulder level and opposite leg to hip level. Repeat _10__ times. Do _2__ times a day.   Child's Pose Pelvic Floor Lengthening    Sit in knee-chest position and reach arms forward. Separate knees for comfort. Hold position for _5__ breaths. Repeat _2-3__ times. Do _1-2__ times per day.   Take 5 deep breaths, allowing gravity to gently pull you into a deeper stretch with each breath. Repeat 2-3 times, once a day.

## 2018-06-02 NOTE — Therapy (Signed)
[place] MAIN [facility] SERVICES [address] [place], [place], [address] Phone: [phone]   Fax:  [phone]  Physical Therapy Treatment  Patient Details  Name: Danielle Burton MRN: [id number] Date of Birth: [birth year] Referring Provider (PT): Hassan Buckler    Encounter Date: 06/02/2018  PT End of Session - 06/04/18 2321    Visit Number  5    Number of Visits  8    Date for PT Re-Evaluation  06/24/18    PT Start Time  3790    PT Stop Time  1630    PT Time Calculation (min)  60 min    Activity Tolerance  Patient tolerated treatment well    Behavior During Therapy  [facility] for tasks assessed/performed       No past medical history on file.  No past surgical history on file.  There were no vitals filed for this visit.    Pelvic Floor Physical Therapy Treatment Note  SCREENING  Changes in medications, allergies, or medical history?: none    SUBJECTIVE  Patient reports: Back still hurts ~ 3-4 days per week since last vistit  Precautions:  opiod addiction  Pain update:  Location of pain: low back Current pain:  0/10  Max pain:  6/10 Least pain: 0/10 Nature of pain: achy  Patient Goals: Not to have pain.   OBJECTIVE  Changes in: Posture/Observations:  Slight anterior R rotation  Range of Motion/Flexibilty:  Decreased sacral mobility at R>L sacral base and through thoracic spine.  Palpation: TTP to B lumbar paraspinals and glute min.  INTERVENTIONS THIS SESSION: Manual: Performed PA mobs to B sacrum, L-S junction, and through the thoracic spine and TP release to B thoraco-lumbar paraspinals to decrease spasm to prevent return of pain.  Therex: reviewed and practiced mini-marches and added to HEP and Educated on and practiced bird-dog to improve deep-core stability and prevent return of spasm. Educated on and practiced child's pose and frog stretch to improve balance of musculature surrounding and within the  pelvis and to decrease pain.  Total time: 60 min.                         PT Education - 06/04/18 2321    Education Details  See Pt. Instructions and Interventions this session    Person(s) Educated  Patient    Methods  Explanation;Verbal cues    Comprehension  Verbalized understanding;Returned demonstration;Verbal cues required       PT Short Term Goals - 04/30/18 0841      PT SHORT TERM GOAL #1   Title  Patient will report a reduction in pain to no greater than 5/10 over the prior week to demonstrate symptom improvement.    Baseline  10/10 max    Time  4    Period  Weeks    Status  New    Target Date  05/28/18      PT SHORT TERM GOAL #2   Title  Patient will demonstrate HEP x1 in the clinic to demonstrate understanding and proper form to allow for further improvement.    Time  4    Period  Weeks    Status  New    Target Date  05/28/18        PT Long Term Goals - 04/30/18 2409      PT LONG TERM GOAL #1   Title  Patient will describe pain no greater than 1/10 during ADLs  and Work-related activity to demonstrate improved functional ability.    Baseline  max 10/10 pain    Time  8    Period  Weeks    Status  New    Target Date  06/25/18      PT LONG TERM GOAL #2   Title  Patient will demonstrate improved sitting and standing posture to demonstrate learning and decrease stress on the pelvic floor with functional activity.    Baseline  Anterior pelvic tilt and forward rolled shoulders    Time  8    Period  Weeks    Status  New    Target Date  06/25/18      PT LONG TERM GOAL #3   Title  Patient will demonstrate improved pelvic alignment and balance of musculature surrounding the pelvis to facilitate continued prevention of spasms and decreased pelvic pain.    Baseline  R andterior/L posterior rotation and spasms surrounding pelvis.    Time  8    Period  Weeks    Status  New    Target Date  06/25/18            Plan - 06/04/18 2322     Clinical Impression Statement  Pt. responded well to all intervetions today, demonstrating improved mobility and alignment and understanding of al education provided. continue per POC.    Clinical Presentation  Stable    Clinical Decision Making  Low    Rehab Potential  Good    PT Frequency  1x / week    PT Duration  8 weeks    PT Treatment/Interventions  ADLs/Self Care Home Management;Electrical Stimulation;Traction;Moist Heat;Functional mobility training;Therapeutic activities;Therapeutic exercise;Patient/family education;Neuromuscular re-education;Manual techniques;Dry needling;Spinal Manipulations;Joint Manipulations    PT Next Visit Plan  review deep-core strengthening, PA to thoracic spine, DN to pectoral muscles PRN    PT Home Exercise Plan  MET correction for R anterior rotation, R hip-flexor stretch and L side-stretch, Thoracic extensions over towel roll.    Consulted and Agree with Plan of Care  Patient       Patient will benefit from skilled therapeutic intervention in order to improve the following deficits and impairments:  Improper body mechanics, Pain, Decreased mobility, Increased muscle spasms, Postural dysfunction, Decreased range of motion  Visit Diagnosis: Other muscle spasm  Posture abnormality  Sacrococcygeal disorders, not elsewhere classified     Problem List Patient Active Problem List   Diagnosis Date Noted  . Alcohol abuse 12/08/2017  . Substance induced mood disorder ([place]) 12/08/2017  . Benzodiazepine abuse ([place]) 12/08/2017  . GAD (generalized anxiety disorder) 10/20/2017  . Moderate episode of recurrent major depressive disorder ([place]) 10/20/2017   Willa Rough DPT, ATC Willa Rough 06/04/2018, 11:25 PM  [place] MAIN [facility] SERVICES [address] [place], [place], [address] Phone: [phone]   Fax:  [phone]  Name: Danielle Burton MRN: [id number] Date of Birth: 01/08/1996

## 2018-06-09 ENCOUNTER — Ambulatory Visit: Payer: Commercial Managed Care - PPO

## 2018-06-09 DIAGNOSIS — M533 Sacrococcygeal disorders, not elsewhere classified: Secondary | ICD-10-CM

## 2018-06-09 DIAGNOSIS — M62838 Other muscle spasm: Secondary | ICD-10-CM

## 2018-06-09 DIAGNOSIS — R293 Abnormal posture: Secondary | ICD-10-CM

## 2018-06-09 NOTE — Therapy (Signed)
[place] MAIN [facility] SERVICES [address] [place], [place], [address] Phone: [phone]   Fax:  [phone]  Physical Therapy Treatment  Patient Details  Name: Danielle Burton MRN: [id number] Date of Birth: 11-12-1995 Referring Provider (PT): Hassan Buckler    Encounter Date: 06/09/2018  PT End of Session - 06/10/18 1327    Visit Number  6    Number of Visits  8    Date for PT Re-Evaluation  06/24/18    PT Start Time  4503    PT Stop Time  1630    PT Time Calculation (min)  60 min    Activity Tolerance  Patient tolerated treatment well    Behavior During Therapy  [facility] for tasks assessed/performed       No past medical history on file.  No past surgical history on file.  There were no vitals filed for this visit.    Pelvic Floor Physical Therapy Treatment Note  SCREENING  Changes in medications, allergies, or medical history?: no    SUBJECTIVE  Patient reports: She is still having menstrual cramps, going on 2 weeks now, bleeding with variable amount.  Precautions:  Opioid addiction  Pain update:  Location of pain: lower abdomen Current pain:  6/10  Max pain:  6/10 Least pain:  6/10 Nature of pain: cramps (menstrual)   *no low back pain at all since last visit.  Patient Goals: Not to have pain.    OBJECTIVE  Changes in: Posture/Observations:  Minor R anterior rotation of R innominate. Slight anterior shift of L3-5 spinous processes which, combined with pain and posture pattern put potential of spondy as a concern.   Range of Motion/Flexibilty:  Slightly decreased L2 mobility, pain with pressure.  Palpation: TTP at  B lumbar paraspinals near L-S junction and to R Psoas and Iliacus   INTERVENTIONS THIS SESSION: Manual: performed TP release to B lumbar paraspinals near L-S junction and to R Psoas and Iliacus to decrease tension pulling into R anterior rotation and decrease pain. Therex: reviewed  importance of deep-core strengthening and how to perform self MET-correction to further improve alignment and decrease likelihood of pain recurring. Self-care: educated on possible mild spondy and when/if to be concerned, discussed goals and educated on the importance of keeping the core strong to decrease potential return of symptoms. Pt. Will call to cancel if she is pain-free after period ends.  Total time: 60 min.                          PT Education - 06/10/18 1327    Education Details  See Interventions this session.    Person(s) Educated  Patient    Methods  Explanation;Demonstration    Comprehension  Verbalized understanding;Returned demonstration       PT Short Term Goals - 06/09/18 1543      PT SHORT TERM GOAL #1   Title  Patient will report a reduction in pain to no greater than 5/10 over the prior week to demonstrate symptom improvement.    Baseline  10/10 max    Time  4    Period  Weeks    Status  Achieved    Target Date  05/28/18      PT SHORT TERM GOAL #2   Title  Patient will demonstrate HEP x1 in the clinic to demonstrate understanding and proper form to allow for further improvement.    Time  4  Period  Weeks    Status  Achieved    Target Date  05/28/18        PT Long Term Goals - 06/09/18 1542      PT LONG TERM GOAL #1   Title  Patient will describe pain no greater than 1/10 during ADLs and Work-related activity to demonstrate improved functional ability.    Baseline  max 10/10 pain    Time  8    Period  Weeks    Status  Achieved    Target Date  06/25/18      PT LONG TERM GOAL #2   Title  Patient will demonstrate improved sitting and standing posture to demonstrate learning and decrease stress on the pelvic floor with functional activity.    Baseline  Anterior pelvic tilt and forward rolled shoulders    Time  8    Period  Weeks    Status  Achieved    Target Date  06/25/18      PT LONG TERM GOAL #3   Title  Patient will  demonstrate improved pelvic alignment and balance of musculature surrounding the pelvis to facilitate continued prevention of spasms and decreased pelvic pain.    Baseline  R andterior/L posterior rotation and spasms surrounding pelvis.    Time  8    Period  Weeks    Status  New            Plan - 06/10/18 1328    Clinical Impression Statement  Pt. responded well to all interventions today, demonstrating decreased spasm and slightly improved alignment following treatment and understanding of all education provided.    Clinical Presentation  Stable    Clinical Decision Making  Low    Rehab Potential  Good    PT Frequency  1x / week    PT Duration  8 weeks    PT Treatment/Interventions  ADLs/Self Care Home Management;Electrical Stimulation;Traction;Moist Heat;Functional mobility training;Therapeutic activities;Therapeutic exercise;Patient/family education;Neuromuscular re-education;Manual techniques;Dry needling;Spinal Manipulations;Joint Manipulations    PT Next Visit Plan  re-assess goals, check for R anterior rotation, review core strengthening D/C if ready.    PT Home Exercise Plan  MET correction for R anterior rotation, R hip-flexor stretch and L side-stretch, Thoracic extensions over towel roll.    Consulted and Agree with Plan of Care  Patient       Patient will benefit from skilled therapeutic intervention in order to improve the following deficits and impairments:  Improper body mechanics, Pain, Decreased mobility, Increased muscle spasms, Postural dysfunction, Decreased range of motion  Visit Diagnosis: Other muscle spasm  Posture abnormality  Sacrococcygeal disorders, not elsewhere classified     Problem List Patient Active Problem List   Diagnosis Date Noted  . Alcohol abuse 12/08/2017  . Substance induced mood disorder ([place]) 12/08/2017  . Benzodiazepine abuse ([place]) 12/08/2017  . GAD (generalized anxiety disorder) 10/20/2017  . Moderate episode of recurrent  major depressive disorder ([place]) 10/20/2017   Willa Rough DPT, ATC Willa Rough 06/10/2018, 1:30 PM  [place] MAIN [facility] SERVICES [address] [place], [place], [address] Phone: [phone]   Fax:  [phone]  Name: Danielle Burton MRN: [id number] Date of Birth: [birth year]

## 2018-06-16 ENCOUNTER — Ambulatory Visit: Payer: Commercial Managed Care - PPO

## 2018-06-19 IMAGING — DX DG CHEST 1V PORT
1 series · 1 of 1 positions shown · non-contrast
Comparison: None.

CLINICAL DATA: Heroin overdose

EXAM:
PORTABLE CHEST 1 VIEW

[chest ap]
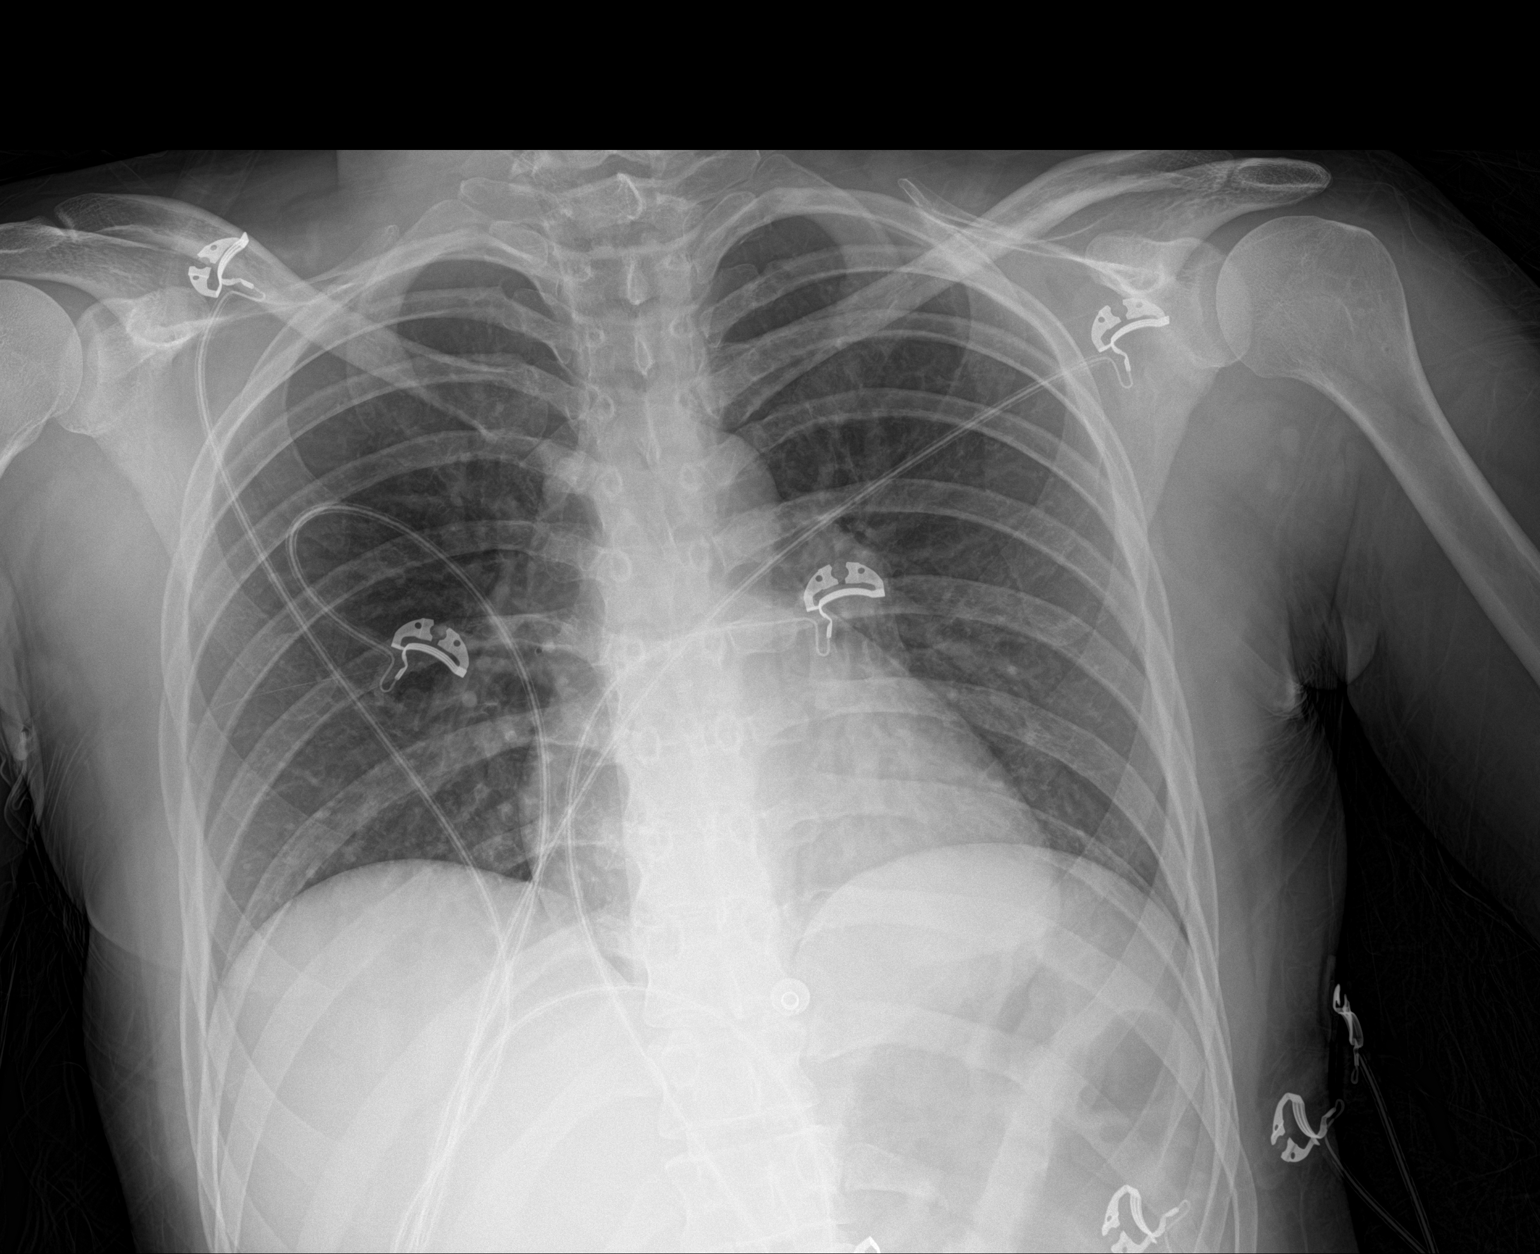

[1 of 1 positions shown; findings below may reference images not displayed]

FINDINGS: The heart size and mediastinal contours are within normal limits.
Both lungs are clear. The visualized skeletal structures are
unremarkable.
IMPRESSION: No active disease.

## 2018-06-23 ENCOUNTER — Ambulatory Visit: Payer: Commercial Managed Care - PPO | Attending: Obstetrics and Gynecology

## 2018-09-07 ENCOUNTER — Other Ambulatory Visit: Payer: Self-pay | Admitting: Gastroenterology

## 2018-09-07 DIAGNOSIS — R140 Abdominal distension (gaseous): Secondary | ICD-10-CM

## 2018-09-07 DIAGNOSIS — R1013 Epigastric pain: Secondary | ICD-10-CM

## 2018-09-07 DIAGNOSIS — K219 Gastro-esophageal reflux disease without esophagitis: Secondary | ICD-10-CM

## 2018-09-07 DIAGNOSIS — R112 Nausea with vomiting, unspecified: Secondary | ICD-10-CM

## 2018-09-13 ENCOUNTER — Encounter: Payer: Self-pay | Admitting: Radiology

## 2018-09-13 ENCOUNTER — Ambulatory Visit
Admission: RE | Admit: 2018-09-13 | Discharge: 2018-09-13 | Disposition: A | Discharge status: Home / Self Care | Payer: Commercial Managed Care - PPO | Source: Ambulatory Visit | Attending: Gastroenterology | Admitting: Gastroenterology

## 2018-09-13 DIAGNOSIS — R1013 Epigastric pain: Secondary | ICD-10-CM | POA: Diagnosis present

## 2018-09-13 DIAGNOSIS — R140 Abdominal distension (gaseous): Secondary | ICD-10-CM | POA: Insufficient documentation

## 2018-09-13 DIAGNOSIS — K219 Gastro-esophageal reflux disease without esophagitis: Secondary | ICD-10-CM | POA: Diagnosis present

## 2018-09-13 DIAGNOSIS — R112 Nausea with vomiting, unspecified: Secondary | ICD-10-CM | POA: Insufficient documentation

## 2018-12-29 ENCOUNTER — Ambulatory Visit: Payer: Self-pay | Admitting: Nurse Practitioner

## 2018-12-29 ENCOUNTER — Other Ambulatory Visit: Payer: Self-pay

## 2018-12-29 ENCOUNTER — Encounter: Payer: Self-pay | Admitting: Nurse Practitioner

## 2018-12-29 ENCOUNTER — Telehealth: Payer: Self-pay

## 2018-12-29 VITALS — BP 127/52 | Temp 98.3°F | Resp 16 | Wt 147.0 lb

## 2018-12-29 DIAGNOSIS — M940 Chondrocostal junction syndrome [Tietze]: Secondary | ICD-10-CM

## 2018-12-29 DIAGNOSIS — F1111 Opioid abuse, in remission: Secondary | ICD-10-CM

## 2018-12-29 MED ORDER — IBUPROFEN 800 MG PO TABS: ORAL_TABLET | ORAL | 0 refills | Status: DC

## 2018-12-29 NOTE — Patient Instructions (Addendum)
Danielle Burton, it was nice meeting you today! Please reach out to the employee assistance program to assist you with getting resources in the community or counseling! Was called back after visit and given information for NA and she can do virtual visits; to call if she needs any further assistance  Encouraged patient to call the office or primary care doctor for an appointment if no improvement in symptoms or if symptoms change or worsen after 72 hours of planned treatment. Patient verbalized understanding of all instructions given/reviewed and has no further questions or concerns at this time.     Costochondritis   Costochondritis is swelling and irritation (inflammation) of the tissue (cartilage) that connects your ribs to your breastbone (sternum). This causes pain in the front of your chest. The pain usually starts gradually and involves more than one rib. What are the causes? The exact cause of this condition is not always known. It results from stress on the cartilage where your ribs attach to your sternum. The cause of this stress could be:  Chest injury (trauma).  Exercise or activity, such as lifting.  Severe coughing. What increases the risk? You may be at higher risk for this condition if you:  Are female.  Are 107?25 years old.  Recently started a new exercise or work activity.  Have low levels of vitamin D.  Have a condition that makes you cough frequently. What are the signs or symptoms? The main symptom of this condition is chest pain. The pain:  Usually starts gradually and can be sharp or dull.  Gets worse with deep breathing, coughing, or exercise.  Gets better with rest.  May be worse when you press on the sternum-rib connection (tenderness). How is this diagnosed? This condition is diagnosed based on your symptoms, medical history, and a physical exam. Your health care provider will check for tenderness when pressing on your sternum. This is the most important  finding. You may also have tests to rule out other causes of chest pain. These may include:  A chest X-ray to check for lung problems.  An electrocardiogram (ECG) to see if you have a heart problem that could be causing the pain.  An imaging scan to rule out a chest or rib fracture. How is this treated? This condition usually goes away on its own over time. Your health care provider may prescribe an NSAID to reduce pain and inflammation. Your health care provider may also suggest that you:  Rest and avoid activities that make pain worse.  Apply heat or cold to the area to reduce pain and inflammation.  Do exercises to stretch your chest muscles. If these treatments do not help, your health care provider may inject a numbing medicine at the sternum-rib connection to help relieve the pain. Follow these instructions at home:  Avoid activities that make pain worse. This includes any activities that use chest, abdominal, and side muscles.  If directed, put ice on the painful area: ? Put ice in a plastic bag. ? Place a towel between your skin and the bag. ? Leave the ice on for 20 minutes, 2-3 times a day.  If directed, apply heat to the affected area as often as told by your health care provider. Use the heat source that your health care provider recommends, such as a moist heat pack or a heating pad. ? Place a towel between your skin and the heat source. ? Leave the heat on for 20-30 minutes. ? Remove the heat if your  skin turns bright red. This is especially important if you are unable to feel pain, heat, or cold. You may have a greater risk of getting burned.  Take over-the-counter and prescription medicines only as told by your health care provider.  Return to your normal activities as told by your health care provider. Ask your health care provider what activities are safe for you.  Keep all follow-up visits as told by your health care provider. This is important. Contact a health  care provider if:  You have chills or a fever.  Your pain does not go away or it gets worse.  You have a cough that does not go away (is persistent). Get help right away if:  You have shortness of breath. This information is not intended to replace advice given to you by your health care provider. Make sure you discuss any questions you have with your health care provider. Document Released: 05/07/2005 Document Revised: 04/29/2017 Document Reviewed: 11/21/2015 Elsevier Interactive Patient Education  [employer]2019 [employer]

## 2018-12-29 NOTE — Progress Notes (Signed)
   Subjective:    Patient ID: Danielle Burton, female    DOB: Jun 09, 1996, 23 y.o.   MRN: [id number]  HPI Danielle Burton is here today with c/o right rib pain x 5 days that makes it difficult to take a deep breath. She admits she was dx'd with bronchitis a month ago and got better after a few weeks but now has these complaints. Admits had a lot of coughing which is resolved, denies fever or GI symptoms.   She expresses that she wants to get some support for hx of drug use in which she reports she started "cutting" in high school and liked the feeling she got muscle relaxers when taking more then the recommended dose at that time. She reports that her drug continued and always felt "depressed". 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 She was hospitalized after OD of xanax as she admits she likes "downers" She reports she has been clean over a year after witnessing friend OD. She did this on her own by locking herself in her room. She reports she's also tried suboxone in the past. She denies any current or daily urges but has this at times and gets upset and "cries for hours" but does not go back to using drugs. She reports she has no friends because they all use drugs. PHQ2: negative. Denies SI/HI.    Review of Systems  Constitutional: Negative for fever.  Respiratory: Positive for shortness of breath. Negative for cough.   Cardiovascular: Negative for chest pain.  Gastrointestinal: Negative for nausea and vomiting.  Musculoskeletal: Positive for arthralgias.  Skin: Negative for rash.  Psychiatric/Behavioral: Negative for agitation, behavioral problems, confusion, decreased concentration, self-injury and suicidal ideas. The patient is not nervous/anxious and is not hyperactive.        Objective:   Physical Exam Vitals signs reviewed.  Constitutional:      Appearance: She is well-developed. She is toxic-appearing.  HENT:     Head: Normocephalic.  Neck:     Musculoskeletal: Normal  range of motion.  Cardiovascular:     Rate and Rhythm: Normal rate and regular rhythm.     Heart sounds: Normal heart sounds.  Pulmonary:     Effort: Pulmonary effort is normal. No respiratory distress.     Breath sounds: Normal breath sounds.     Comments: Tenderness over the right 10-12 ICS space of anterior wall with no erythema, discoloration or edema. Abdominal:     General: Abdomen is flat. Bowel sounds are normal. There is no distension.     Palpations: Abdomen is soft.     Comments: No palpable liver.   Musculoskeletal: Normal range of motion.  Skin:    General: Skin is warm and dry.  Neurological:     General: No focal deficit present.     Mental Status: She is alert and oriented to person, place, and time.  Psychiatric:        Mood and Affect: Mood normal. Mood is not anxious or depressed.        Behavior: Behavior normal.           Assessment & Plan:

## 2018-12-29 NOTE — Telephone Encounter (Signed)
Contacted patient to let her know about Rehab Assistance.  NA meeting are on line and will allow patients to join meetings  The patient can request a sponsor for opoid dependence during the meeting.  Patient states that she will call and join the meeting.  We will follow up with her next week to see if she needs any further assistance.

## 2018-12-30 ENCOUNTER — Other Ambulatory Visit: Payer: Self-pay | Admitting: Nurse Practitioner

## 2018-12-30 DIAGNOSIS — D72829 Elevated white blood cell count, unspecified: Secondary | ICD-10-CM

## 2018-12-30 LAB — CMP14+EGFR
ALT: 19 IU/L (ref 0–32)
AST: 22 IU/L (ref 0–40)
Albumin/Globulin Ratio: 2.1 (ref 1.2–2.2)
Albumin: 5 g/dL (ref 3.9–5.0)
Alkaline Phosphatase: 64 IU/L (ref 39–117)
BUN/Creatinine Ratio: 10 (ref 9–23)
BUN: 7 mg/dL (ref 6–20)
Bilirubin Total: 0.3 mg/dL (ref 0.0–1.2)
CO2: 22 mmol/L (ref 20–29)
Calcium: 10.2 mg/dL (ref 8.7–10.2)
Chloride: 102 mmol/L (ref 96–106)
Creatinine, Ser: 0.69 mg/dL (ref 0.57–1.00)
GFR calc Af Amer: 140 mL/min/{1.73_m2} (ref >59)
GFR calc non Af Amer: 121 mL/min/{1.73_m2} (ref >59)
Globulin, Total: 2.4 g/dL (ref 1.5–4.5)
Glucose: 85 mg/dL (ref 65–99)
Potassium: 3.9 mmol/L (ref 3.5–5.2)
Sodium: 140 mmol/L (ref 134–144)
Total Protein: 7.4 g/dL (ref 6.0–8.5)

## 2018-12-30 LAB — CBC WITH DIFFERENTIAL
Basophils Absolute: 0.1 10*3/uL (ref 0.0–0.2)
Basos: 1 %
EOS (ABSOLUTE): 0 10*3/uL (ref 0.0–0.4)
Eos: 0 %
Hematocrit: 42.3 % (ref 34.0–46.6)
Hemoglobin: 14.8 g/dL (ref 11.1–15.9)
Immature Grans (Abs): 0 10*3/uL (ref 0.0–0.1)
Immature Granulocytes: 0 %
Lymphocytes Absolute: 2.3 10*3/uL (ref 0.7–3.1)
Lymphs: 20 %
MCH: 30.8 pg (ref 26.6–33.0)
MCHC: 35 g/dL (ref 31.5–35.7)
MCV: 88 fL (ref 79–97)
Monocytes Absolute: 0.7 10*3/uL (ref 0.1–0.9)
Monocytes: 6 %
Neutrophils Absolute: 8.5 10*3/uL — ABNORMAL HIGH (ref 1.4–7.0)
Neutrophils: 73 %
RBC: 4.8 x10E6/uL (ref 3.77–5.28)
RDW: 12 % (ref 11.7–15.4)
WBC: 11.7 10*3/uL — ABNORMAL HIGH (ref 3.4–10.8)

## 2018-12-30 MED ORDER — DOXYCYCLINE HYCLATE 100 MG PO TABS: 100.0000 mg | ORAL_TABLET | Freq: Two times a day (BID) | ORAL | 0 refills | Status: DC

## 2018-12-30 NOTE — Telephone Encounter (Signed)
Contacted patient to notify her that her WBC was elevated.  Will treat her with antibiotics so she will need to pick up the prescription up at her pharmacy.  If symptoms are no better in 72 hours then please contact the clinic to let us know.

## 2018-12-30 NOTE — Progress Notes (Signed)
Message to nurse to contact patient:  Please let Danielle Burton know that I'm concerned of some infection. She has elevated wbc with neutrophils which have all been normal in the past. She may have PNA and I will treat based on symptoms and CBC findings. Continue ibuprofen for pain and take all abx as directed. To return to clinic in 2 weeks for repeat CBC. Sooner if she's not improving over the next 72 hours for further evaluation.

## 2019-01-05 ENCOUNTER — Other Ambulatory Visit: Payer: Self-pay

## 2019-01-05 ENCOUNTER — Telehealth: Payer: Self-pay | Admitting: Nurse Practitioner

## 2019-01-05 DIAGNOSIS — D72829 Elevated white blood cell count, unspecified: Secondary | ICD-10-CM

## 2019-01-05 MED ORDER — LEVOFLOXACIN 750 MG PO TABS: 750.0000 mg | ORAL_TABLET | Freq: Every day | ORAL | 0 refills | Status: DC

## 2019-01-05 NOTE — Patient Instructions (Signed)
Try to increase fluids, eat a bland diet Stop doxycyline and start Levaquin as directed Start taking 2 tylenol and ibuprofen this afternoon with food and Tylenol in the evening If fever not improving or symptoms worsen to go to ED for further evaluation and treatment; verbalized understanding of plan

## 2019-01-05 NOTE — Progress Notes (Signed)
   Subjective:    Patient ID: Danielle Burton, female    DOB: [birth year], 23 y.o.   MRN: [id number]  HPI Danielle Burton is on a telephonic OV today which she was seen in office 12/29/2018 with c/o right rib pain and some SOB. She was able to palpate the area of tenderness and had just gotten over bronchitis. Labs returned found leukocytosis and elevated neutrophils. She was put on doxy and reports the SOB has resolved and pain is no longer present. She has since started to report she feels like she's having fevers with chills that come and go but don't have a thermometer to check. She reports some nausea and vomiting "bile" in the mornings that started yesterday with a loss of appetite. She's reporting the urge to "poop" but just urgency with no diarrhea. She was able to eat last HS and reports able to tolerate fluids. Worked yesterday but unable to go today.   Verbal consent given to treat  Review of Systems  Constitutional: Positive for chills, fatigue and fever.  Respiratory: Negative for shortness of breath.   Cardiovascular: Negative for chest pain.  Gastrointestinal: Positive for vomiting. Negative for diarrhea.       Objective:   Physical Exam Sounds calm and answers questions promptly and appropriately       Assessment & Plan:

## 2019-01-07 NOTE — Telephone Encounter (Signed)
Called patient to follow up. She reports that she feels much better and is doing well with the new medication. She reports her fever went away yesterday and is doing well. Instructed to continue Levaquin as directed, hydration,nutrition encouraged. To follow up or call office if symptoms return or needs to be seen and verbalized understanding.

## 2019-03-26 IMAGING — CR DG UGI W/ SMALL BOWEL
13 of 18 series · 15 of 24 positions shown · non-contrast
Comparison: None.

CLINICAL DATA: Nausea and vomiting for 5 months. Slight worsening.
Gastroesophageal reflux disease medications, have not significantly
helped.

EXAM:
UPPER GI SERIES WITH SMALL BOWEL FOLLOW-THROUGH
FLUOROSCOPY TIME:  0.4 minutes corresponding to a Dose Area Product
of 167 ?Gy*m2
TECHNIQUE: Combined double contrast and single contrast upper GI series using
effervescent crystals, thick barium, and thin barium. Subsequently,
serial images of the small bowel were obtained including spot views
of the terminal ileum.

[fluoro_barium 2fps_bw]
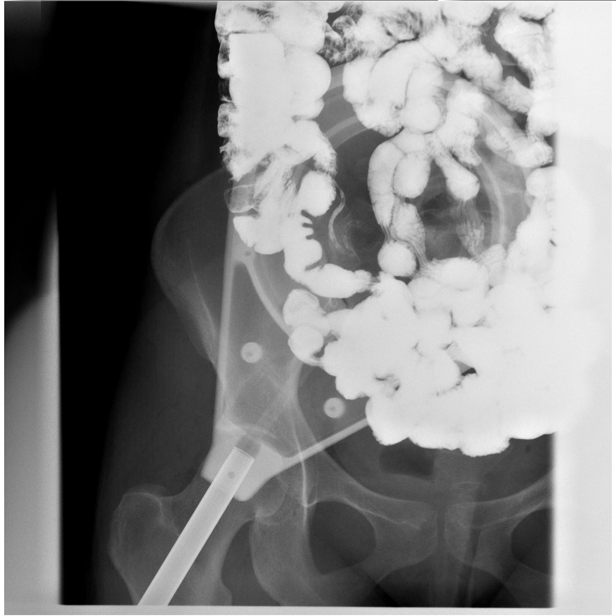

[t abdomen supine (1 of 4)]
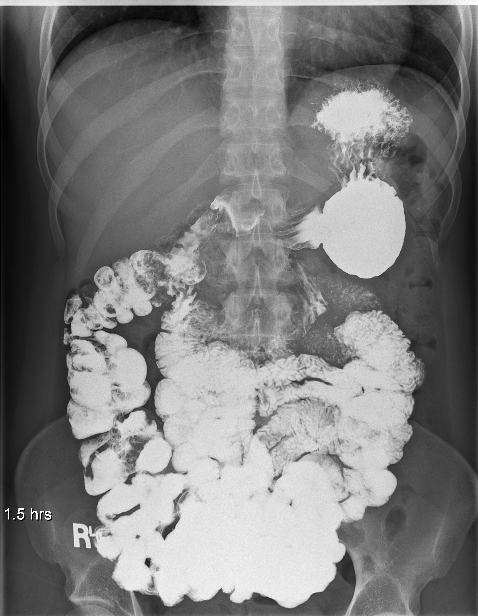

[Series 2: fluoro_barium swallow 2fps_bw · 0.18mm/px · 2 of 2 frames shown (1 of 6)]
[frame 1/2]
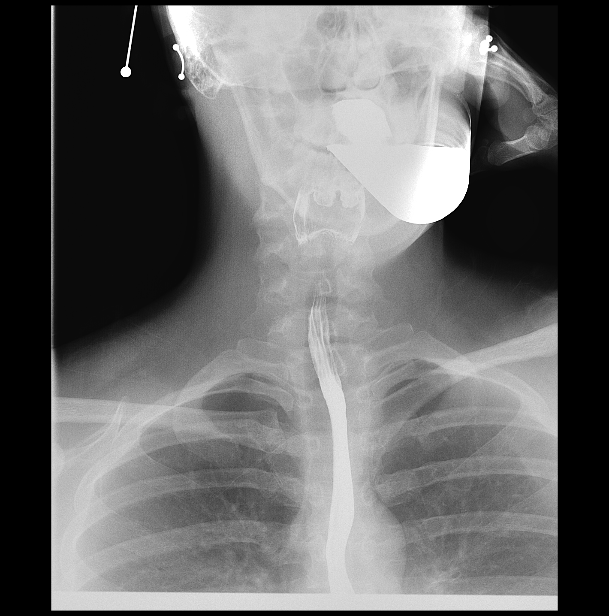
[frame 2/2]
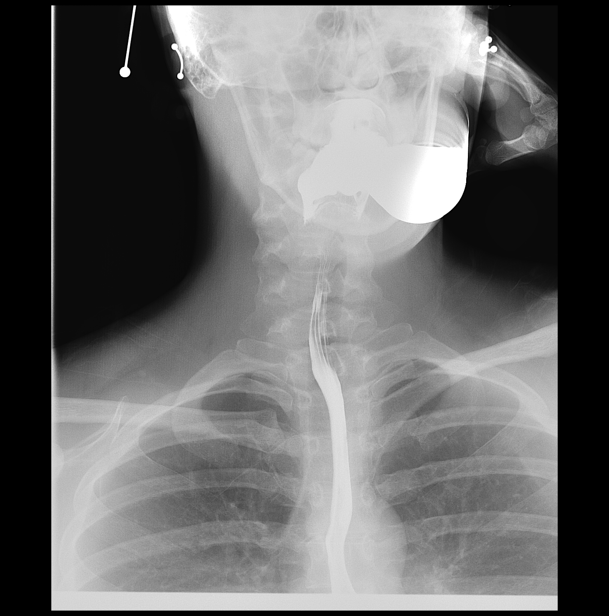

[Series 2: cp_standard · 0.27mm/px · 2 of 3 slices shown (1 of 2)]
[im 2/3]
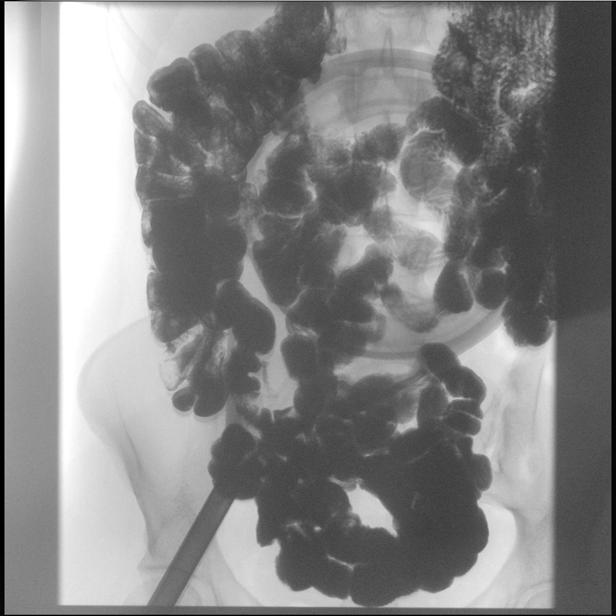
[im 3/3]
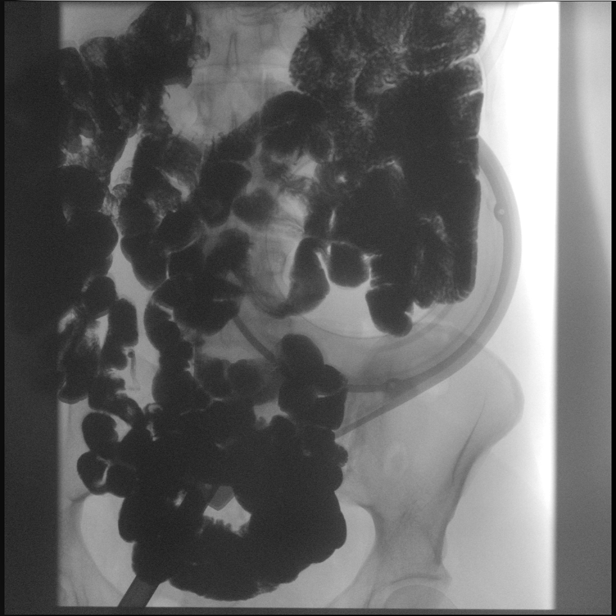

[fluoro_barium swallow 2fps_bw (2 of 6)]
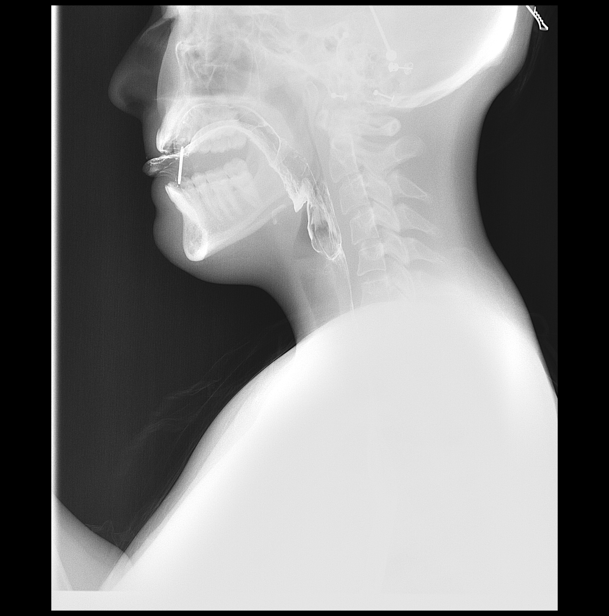

[cp_standard (2 of 2)]
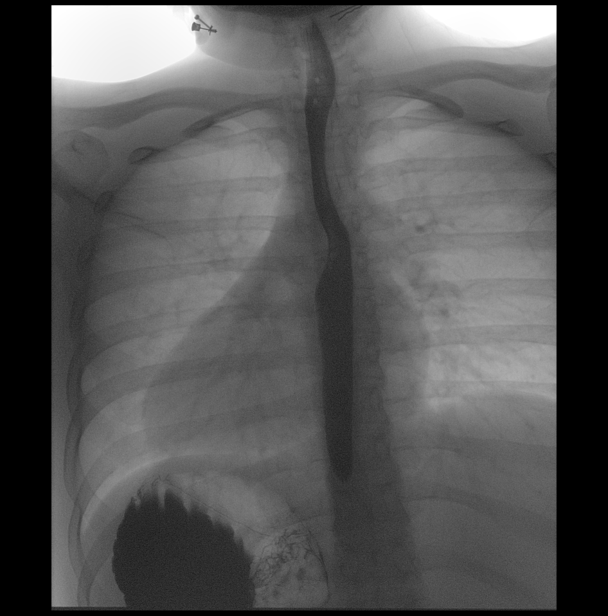

[fluoro_barium swallow 2fps_bw (3 of 6)]
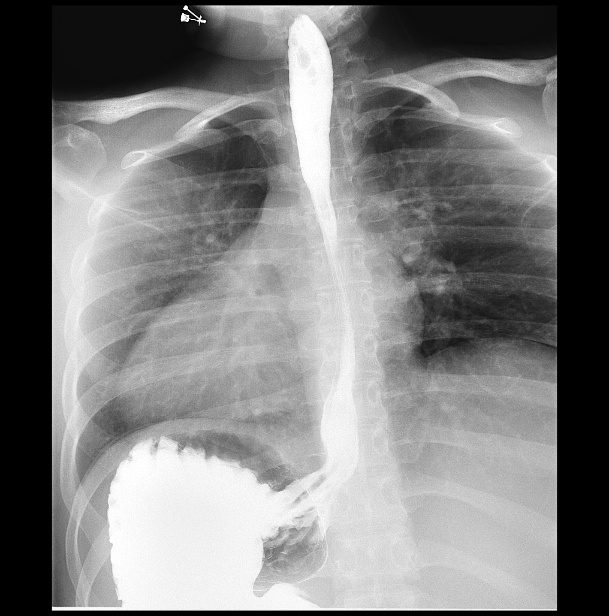

[fluoro_barium swallow 2fps_bw (4 of 6)]
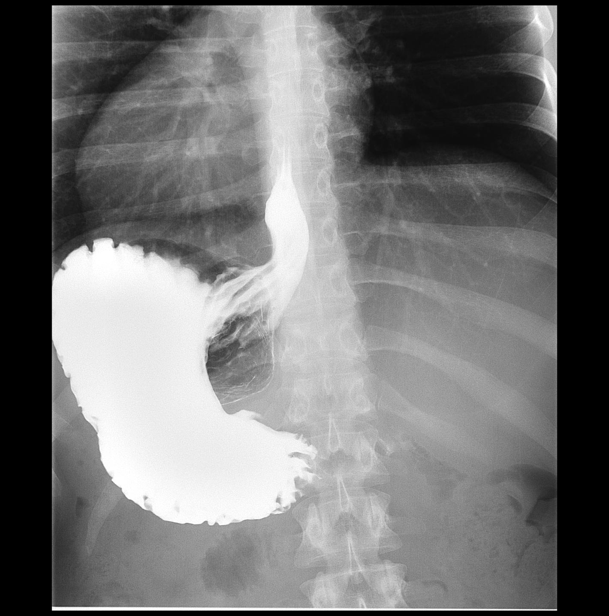

[fluoro_barium swallow 2fps_bw (5 of 6)]
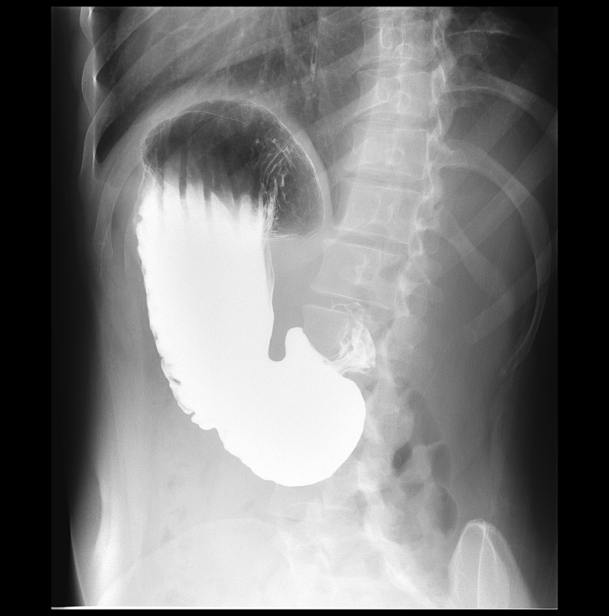

[fluoro_barium swallow 2fps_bw (6 of 6)]
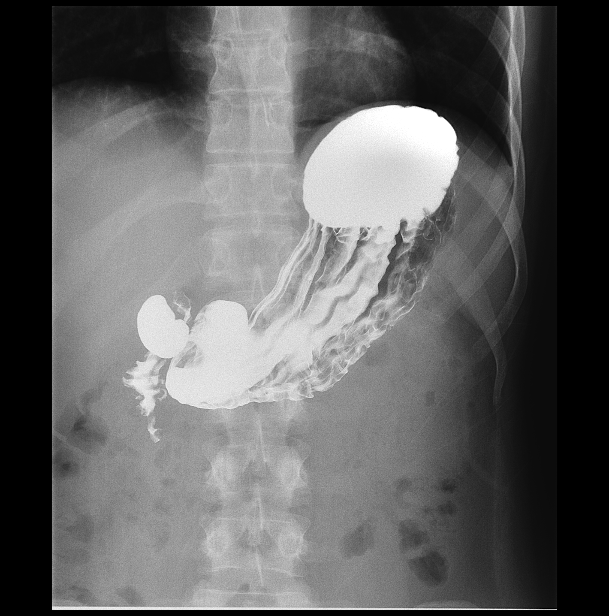

[t abdomen supine (2 of 4)]
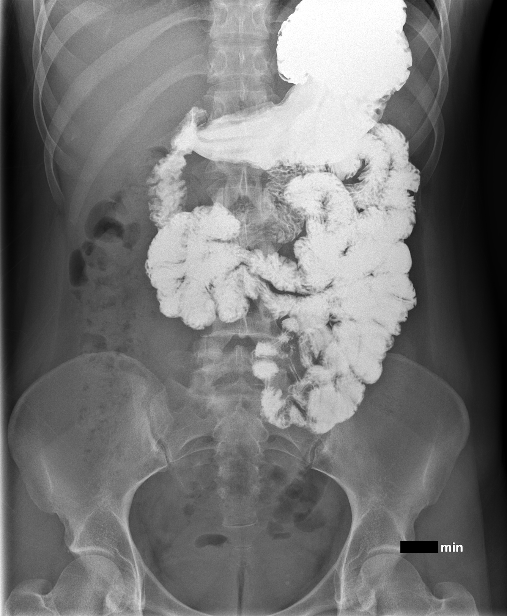

[t abdomen supine (3 of 4)]
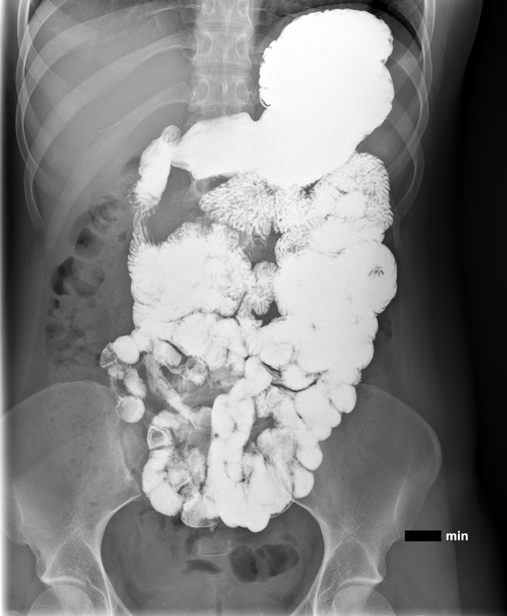

[t abdomen supine (4 of 4)]
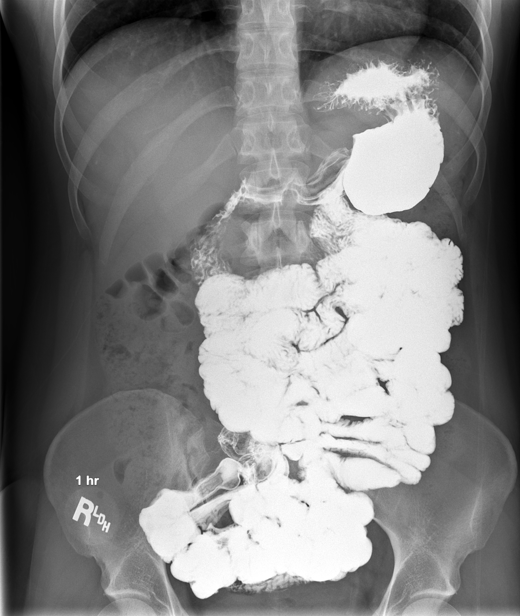

[15 of 24 positions shown; findings below may reference images not displayed]

FINDINGS: Patient was able swallow barium without difficulty. Normal
pharyngeal contours and swallowing mechanism. Normal contour and
peristalsis of the esophagus. No mass, stricture, or ulceration.

Unremarkable gastric folds. No mass or ulceration. No
gastroesophageal reflux. Pylorus and duodenal bulb are normal.

Small bowel follow-through demonstrates normal transit. Unremarkable
duodenum, jejunum, and ileum. No thickened folds, stricture, or
fistula. The appendix fills normally. Normal terminal ileum.
IMPRESSION: Normal exam.

## 2019-04-08 ENCOUNTER — Other Ambulatory Visit: Payer: Self-pay

## 2019-04-08 DIAGNOSIS — Z20822 Contact with and (suspected) exposure to covid-19: Secondary | ICD-10-CM

## 2019-04-09 LAB — NOVEL CORONAVIRUS, NAA: SARS-CoV-2, NAA: NOT DETECTED

## 2019-09-14 ENCOUNTER — Other Ambulatory Visit: Payer: Self-pay

## 2019-09-14 ENCOUNTER — Encounter: Payer: Self-pay | Admitting: Medical

## 2019-09-14 ENCOUNTER — Ambulatory Visit: Payer: Self-pay | Admitting: Medical

## 2019-09-14 ENCOUNTER — Encounter: Payer: Self-pay | Admitting: *Deleted

## 2019-09-14 VITALS — BP 136/83 | HR 86 | Temp 98.9°F | Resp 16 | Wt 143.6 lb

## 2019-09-14 DIAGNOSIS — Z0000 Encounter for general adult medical examination without abnormal findings: Secondary | ICD-10-CM

## 2019-09-14 DIAGNOSIS — F329 Major depressive disorder, single episode, unspecified: Secondary | ICD-10-CM

## 2019-09-14 DIAGNOSIS — F32A Depression, unspecified: Secondary | ICD-10-CM

## 2019-09-14 DIAGNOSIS — R1031 Right lower quadrant pain: Secondary | ICD-10-CM | POA: Insufficient documentation

## 2019-09-14 DIAGNOSIS — F419 Anxiety disorder, unspecified: Secondary | ICD-10-CM

## 2019-09-14 MED ORDER — ESCITALOPRAM OXALATE 10 MG PO TABS: 10.0000 mg | ORAL_TABLET | Freq: Every day | ORAL | 1 refills | Status: DC

## 2019-09-14 NOTE — Progress Notes (Signed)
Patient called the office and wanted to talk to a nurse or the provider. After she got on the phone she only wanted to talk to the provider.  I  Took the phone call, introduced myself and asked her what is going on, she stated she is having a "hard time" , anger problems, anxiety and she feels it is affecting her relationships. She tells me she has a history of drug abuse but has been clean for over 1 year.  She states she feels negative and wants to change. Dr. Netty Starring is listed as her PCP , but she states she only saw him in the hospital when she overdosed in August 2019.  I asked if she could come in for an appointment today and she says yes.    24 yo female in non acute distress.  She had overdosed on Klonopin in  2019 but used Heroin . And other drugs  like Cocaine , Percocet, Oxycontin, she says she  liked downers.and muscle relaxers.She did  Inject one time, she states she  has been tested for blood diseases but not sure of exact tests. She tells me she used to take  Xanax daily,  7 pills a day. She quit drugs   due to her best friend overdosing and ending up in the ICU, she did not die. She has never tried counseling but has been on Lexapro 10mg  in May  2019, but she does not recall much as she was still doing drugs at the same time , the information was noted in her medical chart.  She has  Worked  For [employer] 3 years.   Lives with female significant other who has a child  5 yrs who attends daycare. She feels she does have Support from her significant other during this time period they have been together  1.5 years together.She states she cried all last night. But denies crying daily. Hobbies are running but she has not done it in a while.  Social History Occasionally the patient  Smokes. Every day was drinking  etoh about  2 months ago and then stopped due to problems it caused in her relationship. Currently no drug use, clean over a year.  Family History Father HTN,  Hx of  anxiety noted by patient on no meds or therapy. Paternal grandmother deceased from suicide Mother healthy Older brother dpression , anxiety going through divorce with  2 children.   Allergies  Allergen Reactions  . Sulfa Antibiotics     Unknown, childhood reaction  . Penicillins Rash   On No medications currently    Unaware she had lost weight since May 2020,  11lbs.    Review of Systems  Constitutional: Negative.   HENT: Negative.   Eyes: Negative.   Respiratory: Negative.   Cardiovascular: Negative.   Gastrointestinal: Negative.   Genitourinary: Negative.   Musculoskeletal: Negative.   Skin: Negative.   Neurological: Negative.   Endo/Heme/Allergies: Positive for environmental allergies.  Psychiatric/Behavioral: Positive for depression and substance abuse (H/O heroin addiction ). The patient is nervous/anxious (some anxiety noted but not severe appearing). The patient does not have insomnia (states she sleeps well.).    Physical Exam  Constitutional: She is oriented to person, place, and time and well-developed, well-nourished, and in no distress. Vital signs are normal.  HENT:  Head: Normocephalic and atraumatic.  Eyes: Pupils are equal, round, and reactive to light. Conjunctivae, EOM and lids are normal.  Neck: No JVD present.  Cardiovascular: Normal rate,  regular rhythm, S1 normal, S2 normal and normal heart sounds.  Pulmonary/Chest: Effort normal and breath sounds normal.  Musculoskeletal:        General: Normal range of motion.     Cervical back: Normal range of motion.  Neurological: She is alert and oriented to person, place, and time. She displays facial asymmetry. She displays normal stance. Gait normal. Coordination and gait normal. GCS score is 15.  Skin: Skin is warm and dry.  Psychiatric: Affect and judgment normal. Her mood appears anxious. She exhibits a depressed mood. She expresses no homicidal and no suicidal ideation. She expresses no suicidal plans  and no homicidal plans. She exhibits normal recent memory.  Cannot recall the use of Lexapro or the results of the medication. While she was taking the Lexapro she was also taking street drugs.  Nursing note and vitals reviewed.  GAD-7 21 PHQ-9 14   Dx/Plan Generalized Anxiety Disorder. 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 H/O substance abuse and etoh abuse. She says she is open to starting/trying  medication.  Meds ordered this encounter  Medications  . escitalopram (LEXAPRO) 10 MG tablet    Sig: Take 1 tablet (10 mg total) by mouth daily.    Dispense:  30 tablet    Refill:  1  Reviewed the time it takes for medication to get to therapeutic dose. And black box warning of suicidal thoughts.  Pharmacotherapy/ cognitive therapy recommended. Given information to obtain a family doctor through [facility] referral line. And [employer] number and  information. Asked patient if she would see Psychiatry. She states She says she is thankful for my help and is hoping to improve herself. I reassured the patient, she is to return in one week and sooner if any concerns. I did make her aware of another provider would be here in the clinic Thursday and Friday and if she needed someone to talk to or any concerns to call the clinic. She agreed she would. I also ordered labs.  Lab Orders     CMP12+LP+TP+TSH+6AC+CBC/D/Plt     VITAMIN D 25 Hydroxy (Vit-D Deficiency, Fractures)     POCT urinalysis dipstick. Educational material was given on Lexapro ( no Ibuprofen/Motrin with medication due to risk of gastric bleeding),She verbalizes understanding at the end of our  Conversation and had no further questions at discharge.    Attempted to call patient to check on her before the weekend , she has only her home phone and due to confidentiality I chose not to call . Will get her mobile on Mondays visit.

## 2019-09-14 NOTE — Patient Instructions (Signed)
Generalized Anxiety Disorder, Adult Generalized anxiety disorder (GAD) is a mental health disorder. People with this condition constantly worry about everyday events. Unlike normal anxiety, worry related to GAD is not triggered by a specific event. These worries also do not fade or get better with time. GAD interferes with life functions, including relationships, work, and school. GAD can vary from mild to severe. People with severe GAD can have intense waves of anxiety with physical symptoms (panic attacks). What are the causes? The exact cause of GAD is not known. What increases the risk? This condition is more likely to develop in:  Women.  People who have a family history of anxiety disorders.  People who are very shy.  People who experience very stressful life events, such as the death of a loved one.  People who have a very stressful family environment. What are the signs or symptoms? People with GAD often worry excessively about many things in their lives, such as their health and family. They may also be overly concerned about:  Doing well at work.  Being on time.  Natural disasters.  Friendships. Physical symptoms of GAD include:  Fatigue.  Muscle tension or having muscle twitches.  Trembling or feeling shaky.  Being easily startled.  Feeling like your heart is pounding or racing.  Feeling out of breath or like you cannot take a deep breath.  Having trouble falling asleep or staying asleep.  Sweating.  Nausea, diarrhea, or irritable bowel syndrome (IBS).  Headaches.  Trouble concentrating or remembering facts.  Restlessness.  Irritability. How is this diagnosed? Your health care provider can diagnose GAD based on your symptoms and medical history. You will also have a physical exam. The health care provider will ask specific questions about your symptoms, including how severe they are, when they started, and if they come and go. Your health care  provider may ask you about your use of alcohol or drugs, including prescription medicines. Your health care provider may refer you to a mental health specialist for further evaluation. Your health care provider will do a thorough examination and may perform additional tests to rule out other possible causes of your symptoms. To be diagnosed with GAD, a person must have anxiety that:  Is out of his or her control.  Affects several different aspects of his or her life, such as work and relationships.  Causes distress that makes him or her unable to take part in normal activities.  Includes at least three physical symptoms of GAD, such as restlessness, fatigue, trouble concentrating, irritability, muscle tension, or sleep problems. Before your health care provider can confirm a diagnosis of GAD, these symptoms must be present more days than they are not, and they must last for six months or longer. How is this treated? The following therapies are usually used to treat GAD:  Medicine. Antidepressant medicine is usually prescribed for long-term daily control. Antianxiety medicines may be added in severe cases, especially when panic attacks occur.  Talk therapy (psychotherapy). Certain types of talk therapy can be helpful in treating GAD by providing support, education, and guidance. Options include: ? Cognitive behavioral therapy (CBT). People learn coping skills and techniques to ease their anxiety. They learn to identify unrealistic or negative thoughts and behaviors and to replace them with positive ones. ? Acceptance and commitment therapy (ACT). This treatment teaches people how to be mindful as a way to cope with unwanted thoughts and feelings. ? Biofeedback. This process trains you to manage your body's response (  physiological response) through breathing techniques and relaxation methods. You will work with a therapist while machines are used to monitor your physical symptoms.  Stress  management techniques. These include yoga, meditation, and exercise. A mental health specialist can help determine which treatment is best for you. Some people see improvement with one type of therapy. However, other people require a combination of therapies. Follow these instructions at home:  Take over-the-counter and prescription medicines only as told by your health care provider.  Try to maintain a normal routine.  Try to anticipate stressful situations and allow extra time to manage them.  Practice any stress management or self-calming techniques as taught by your health care provider.  Do not punish yourself for setbacks or for not making progress.  Try to recognize your accomplishments, even if they are small.  Keep all follow-up visits as told by your health care provider. This is important. Contact a health care provider if:  Your symptoms do not get better.  Your symptoms get worse.  You have signs of depression, such as: ? A persistently sad, cranky, or irritable mood. ? Loss of enjoyment in activities that used to bring you joy. ? Change in weight or eating. ? Changes in sleeping habits. ? Avoiding friends or family members. ? Loss of energy for normal tasks. ? Feelings of guilt or worthlessness. Get help right away if:  You have serious thoughts about hurting yourself or others. If you ever feel like you may hurt yourself or others, or have thoughts about taking your own life, get help right away. You can go to your nearest emergency department or call:  Your local emergency services (911 in the [place]  A suicide crisis helpline, such as the [facility] at (804)020-0302. This is open 24 hours a day. Summary  Generalized anxiety disorder (GAD) is a mental health disorder that involves worry that is not triggered by a specific event.  People with GAD often worry excessively about many things in their lives, such as their health and  family.  GAD may cause physical symptoms such as restlessness, trouble concentrating, sleep problems, frequent sweating, nausea, diarrhea, headaches, and trembling or muscle twitching.  A mental health specialist can help determine which treatment is best for you. Some people see improvement with one type of therapy. However, other people require a combination of therapies. This information is not intended to replace advice given to you by your health care provider. Make sure you discuss any questions you have with your health care provider. Document Revised: 07/10/2017 Document Reviewed: 06/17/2016 Elsevier Patient Education  2020 [place] With Depression Everyone experiences occasional disappointment, sadness, and loss in their lives. When you are feeling down, blue, or sad for at least 2 weeks in a row, it may mean that you have depression. Depression can affect your thoughts and feelings, relationships, daily activities, and physical health. It is caused by changes in the way your brain functions. If you receive a diagnosis of depression, your health care provider will tell you which type of depression you have and what treatment options are available to you. If you are living with depression, there are ways to help you recover from it and also ways to prevent it from coming back. How to cope with lifestyle changes Coping with stress     Stress is your body's reaction to life changes and events, both good and bad. Stressful situations may include:  Getting married.  The death of a spouse.  Losing a job.  Retiring.  Having a baby. Stress can last just a few hours or it can be ongoing. Stress can play a major role in depression, so it is important to learn both how to cope with stress and how to think about it differently. Talk with your health care provider or a counselor if you would like to learn more about stress reduction. He or she may suggest some stress reduction  techniques, such as:  Music therapy. This can include creating music or listening to music. Choose music that you enjoy and that inspires you.  Mindfulness-based meditation. This kind of meditation can be done while sitting or walking. It involves being aware of your normal breaths, rather than trying to control your breathing.  Centering prayer. This is a kind of meditation that involves focusing on a spiritual word or phrase. Choose a word, phrase, or sacred image that is meaningful to you and that brings you peace.  Deep breathing. To do this, expand your stomach and inhale slowly through your nose. Hold your breath for 3-5 seconds, then exhale slowly, allowing your stomach muscles to relax.  Muscle relaxation. This involves intentionally tensing muscles then relaxing them. Choose a stress reduction technique that fits your lifestyle and personality. Stress reduction techniques take time and practice to develop. Set aside 5-15 minutes a day to do them. Therapists can offer training in these techniques. The training may be covered by some insurance plans. Other things you can do to manage stress include:  Keeping a stress diary. This can help you learn what triggers your stress and ways to control your response.  Understanding what your limits are and saying no to requests or events that lead to a schedule that is too full.  Thinking about how you respond to certain situations. You may not be able to control everything, but you can control how you react.  Adding humor to your life by watching funny films or TV shows.  Making time for activities that help you relax and not feeling guilty about spending your time this way.  Medicines Your health care provider may suggest certain medicines if he or she feels that they will help improve your condition. Avoid using alcohol and other substances that may prevent your medicines from working properly (may interact). It is also important to:  Talk  with your pharmacist or health care provider about all the medicines that you take, their possible side effects, and what medicines are safe to take together.  Make it your goal to take part in all treatment decisions (shared decision-making). This includes giving input on the side effects of medicines. It is best if shared decision-making with your health care provider is part of your total treatment plan. If your health care provider prescribes a medicine, you may not notice the full benefits of it for 4-8 weeks. Most people who are treated for depression need to be on medicine for at least 6-12 months after they feel better. If you are taking medicines as part of your treatment, do not stop taking medicines without first talking to your health care provider. You may need to have the medicine slowly decreased (tapered) over time to decrease the risk of harmful side effects. Relationships Your health care provider may suggest family therapy along with individual therapy and drug therapy. While there may not be family problems that are causing you to feel depressed, it is still important to make sure your family learns as much as they can  about your mental health. Having your family's support can help make your treatment successful. How to recognize changes in your condition Everyone has a different response to treatment for depression. Recovery from major depression happens when you have not had signs of major depression for two months. This may mean that you will start to:  Have more interest in doing activities.  Feel less hopeless than you did 2 months ago.  Have more energy.  Overeat less often, or have better or improving appetite.  Have better concentration. Your health care provider will work with you to decide the next steps in your recovery. It is also important to recognize when your condition is getting worse. Watch for these signs:  Having fatigue or low energy.  Eating too much or  too little.  Sleeping too much or too little.  Feeling restless, agitated, or hopeless.  Having trouble concentrating or making decisions.  Having unexplained physical complaints.  Feeling irritable, angry, or aggressive. Get help as soon as you or your family members notice these symptoms coming back. How to get support and help from others How to talk with friends and family members about your condition  Talking to friends and family members about your condition can provide you with one way to get support and guidance. Reach out to trusted friends or family members, explain your symptoms to them, and let them know that you are working with a health care provider to treat your depression. Financial resources Not all insurance plans cover mental health care, so it is important to check with your insurance carrier. If paying for co-pays or counseling services is a problem, search for a local or county mental health care center. They may be able to offer public mental health care services at low or no cost when you are not able to see a private health care provider. If you are taking medicine for depression, you may be able to get the generic form, which may be less expensive. Some makers of prescription medicines also offer help to patients who cannot afford the medicines they need. Follow these instructions at home:   Get the right amount and quality of sleep.  Cut down on using caffeine, tobacco, alcohol, and other potentially harmful substances.  Try to exercise, such as walking or lifting small weights.  Take over-the-counter and prescription medicines only as told by your health care provider.  Eat a healthy diet that includes plenty of vegetables, fruits, whole grains, low-fat dairy products, and lean protein. Do not eat a lot of foods that are high in solid fats, added sugars, or salt.  Keep all follow-up visits as told by your health care provider. This is important. Contact a  health care provider if:  You stop taking your antidepressant medicines, and you have any of these symptoms: ? Nausea. ? Headache. ? Feeling lightheaded. ? Chills and body aches. ? Not being able to sleep (insomnia).  You or your friends and family think your depression is getting worse. Get help right away if:  You have thoughts of hurting yourself or others. If you ever feel like you may hurt yourself or others, or have thoughts about taking your own life, get help right away. You can go to your nearest emergency department or call:  Your local emergency services (911 in the [place]  A suicide crisis helpline, such as the [facility] at 437-865-2080. This is open 24-hours a day. Summary  If you are living with depression, there  are ways to help you recover from it and also ways to prevent it from coming back.  Work with your health care team to create a management plan that includes counseling, stress management techniques, and healthy lifestyle habits. This information is not intended to replace advice given to you by your health care provider. Make sure you discuss any questions you have with your health care provider. Document Revised: 11/19/2018 Document Reviewed: 06/30/2016 Elsevier Patient Education  [place]. Escitalopram tablets What is this medicine? ESCITALOPRAM (es sye TAL oh pram) is used to treat depression and certain types of anxiety. This medicine may be used for other purposes; ask your health care provider or pharmacist if you have questions. COMMON BRAND NAME(S): Lexapro What should I tell my health care provider before I take this medicine? They need to know if you have any of these conditions:  bipolar disorder or a family history of bipolar disorder  diabetes  glaucoma  heart disease  kidney or liver disease  receiving electroconvulsive therapy  seizures (convulsions)  suicidal thoughts, plans, or attempt by  you or a family member  an unusual or allergic reaction to escitalopram, the related drug citalopram, other medicines, foods, dyes, or preservatives  pregnant or trying to become pregnant  breast-feeding How should I use this medicine? Take this medicine by mouth with a glass of water. Follow the directions on the prescription label. You can take it with or without food. If it upsets your stomach, take it with food. Take your medicine at regular intervals. Do not take it more often than directed. Do not stop taking this medicine suddenly except upon the advice of your doctor. Stopping this medicine too quickly may cause serious side effects or your condition may worsen. A special MedGuide will be given to you by the pharmacist with each prescription and refill. Be sure to read this information carefully each time. Talk to your pediatrician regarding the use of this medicine in children. Special care may be needed. Overdosage: If you think you have taken too much of this medicine contact a poison control center or emergency room at once. NOTE: This medicine is only for you. Do not share this medicine with others. What if I miss a dose? If you miss a dose, take it as soon as you can. If it is almost time for your next dose, take only that dose. Do not take double or extra doses. What may interact with this medicine? Do not take this medicine with any of the following medications:  certain medicines for fungal infections like fluconazole, itraconazole, ketoconazole, posaconazole, voriconazole  cisapride  citalopram  dronedarone  linezolid  MAOIs like Carbex, Eldepryl, Marplan, Nardil, and Parnate  methylene blue (injected into a vein)  pimozide  thioridazine This medicine may also interact with the following medications:  alcohol  amphetamines  aspirin and aspirin-like medicines  carbamazepine  certain medicines for depression, anxiety, or psychotic disturbances  certain  medicines for migraine headache like almotriptan, eletriptan, frovatriptan, naratriptan, rizatriptan, sumatriptan, zolmitriptan  certain medicines for sleep  certain medicines that treat or prevent blood clots like warfarin, enoxaparin, dalteparin  cimetidine  diuretics  dofetilide  fentanyl  furazolidone  isoniazid  lithium  metoprolol  NSAIDs, medicines for pain and inflammation, like ibuprofen or naproxen  other medicines that prolong the QT interval (cause an abnormal heart rhythm)  procarbazine  rasagiline  supplements like St. John's wort, kava kava, valerian  tramadol  tryptophan  ziprasidone This list may not  describe all possible interactions. Give your health care provider a list of all the medicines, herbs, non-prescription drugs, or dietary supplements you use. Also tell them if you smoke, drink alcohol, or use illegal drugs. Some items may interact with your medicine. What should I watch for while using this medicine? Tell your doctor if your symptoms do not get better or if they get worse. Visit your doctor or health care professional for regular checks on your progress. Because it may take several weeks to see the full effects of this medicine, it is important to continue your treatment as prescribed by your doctor. Patients and their families should watch out for new or worsening thoughts of suicide or depression. Also watch out for sudden changes in feelings such as feeling anxious, agitated, panicky, irritable, hostile, aggressive, impulsive, severely restless, overly excited and hyperactive, or not being able to sleep. If this happens, especially at the beginning of treatment or after a change in dose, call your health care professional. Bonita Quin may get drowsy or dizzy. Do not drive, use machinery, or do anything that needs mental alertness until you know how this medicine affects you. Do not stand or sit up quickly, especially if you are an older patient. This  reduces the risk of dizzy or fainting spells. Alcohol may interfere with the effect of this medicine. Avoid alcoholic drinks. Your mouth may get dry. Chewing sugarless gum or sucking hard candy, and drinking plenty of water may help. Contact your doctor if the problem does not go away or is severe. What side effects may I notice from receiving this medicine? Side effects that you should report to your doctor or health care professional as soon as possible:  allergic reactions like skin rash, itching or hives, swelling of the face, lips, or tongue  anxious  black, tarry stools  changes in vision  confusion  elevated mood, decreased need for sleep, racing thoughts, impulsive behavior  eye pain  fast, irregular heartbeat  feeling faint or lightheaded, falls  feeling agitated, angry, or irritable  hallucination, loss of contact with reality  loss of balance or coordination  loss of memory  painful or prolonged erections  restlessness, pacing, inability to keep still  seizures  stiff muscles  suicidal thoughts or other mood changes  trouble sleeping  unusual bleeding or bruising  unusually weak or tired  vomiting Side effects that usually do not require medical attention (report to your doctor or health care professional if they continue or are bothersome):  changes in appetite  change in sex drive or performance  headache  increased sweating  indigestion, nausea  tremors This list may not describe all possible side effects. Call your doctor for medical advice about side effects. You may report side effects to FDA at 1-800-FDA-1088. Where should I keep my medicine? Keep out of reach of children. Store at room temperature between 15 and 30 degrees C (59 and 86 degrees F). Throw away any unused medicine after the expiration date. NOTE: This sheet is a summary. It may not cover all possible information. If you have questions about this medicine, talk to your  doctor, pharmacist, or health care provider.  2020 Elsevier/[employer] Standard (2018-07-19 11:21:44)

## 2019-09-20 ENCOUNTER — Other Ambulatory Visit: Payer: Self-pay

## 2019-09-20 DIAGNOSIS — Z0000 Encounter for general adult medical examination without abnormal findings: Secondary | ICD-10-CM

## 2019-09-20 LAB — POCT URINALYSIS DIPSTICK
Bilirubin, UA: NEGATIVE
Glucose, UA: NEGATIVE
Ketones, UA: NEGATIVE
Leukocytes, UA: NEGATIVE
Nitrite, UA: NEGATIVE
Protein, UA: POSITIVE — AB
Spec Grav, UA: <=1.005 — AB (ref 1.010–1.025)
Urobilinogen, UA: 0.2 E.U./dL
pH, UA: 8 (ref 5.0–8.0)

## 2019-09-21 ENCOUNTER — Ambulatory Visit: Payer: Self-pay | Admitting: Medical

## 2019-09-21 ENCOUNTER — Encounter: Payer: Self-pay | Admitting: Medical

## 2019-09-21 ENCOUNTER — Other Ambulatory Visit: Payer: Self-pay

## 2019-09-21 VITALS — BP 117/68 | HR 55 | Temp 97.9°F | Resp 16 | Wt 147.2 lb

## 2019-09-21 DIAGNOSIS — F419 Anxiety disorder, unspecified: Secondary | ICD-10-CM

## 2019-09-21 DIAGNOSIS — Z8659 Personal history of other mental and behavioral disorders: Secondary | ICD-10-CM

## 2019-09-21 DIAGNOSIS — E559 Vitamin D deficiency, unspecified: Secondary | ICD-10-CM

## 2019-09-21 LAB — URINALYSIS, ROUTINE W REFLEX MICROSCOPIC
Bilirubin, UA: NEGATIVE
Glucose, UA: NEGATIVE
Ketones, UA: NEGATIVE
Leukocytes,UA: NEGATIVE
Nitrite, UA: NEGATIVE
Protein,UA: NEGATIVE
RBC, UA: NEGATIVE
Specific Gravity, UA: 1.018 (ref 1.005–1.030)
Urobilinogen, Ur: 0.2 mg/dL (ref 0.2–1.0)
pH, UA: 8 — ABNORMAL HIGH (ref 5.0–7.5)

## 2019-09-21 LAB — CMP12+LP+TP+TSH+6AC+CBC/D/PLT
ALT: 41 IU/L — ABNORMAL HIGH (ref 0–32)
AST: 35 IU/L (ref 0–40)
Albumin/Globulin Ratio: 2 (ref 1.2–2.2)
Albumin: 4.5 g/dL (ref 3.9–5.0)
Alkaline Phosphatase: 59 IU/L (ref 39–117)
BUN/Creatinine Ratio: 16 (ref 9–23)
BUN: 12 mg/dL (ref 6–20)
Basophils Absolute: 0.1 10*3/uL (ref 0.0–0.2)
Basos: 2 %
Bilirubin Total: <0.2 mg/dL (ref 0.0–1.2)
Calcium: 9.1 mg/dL (ref 8.7–10.2)
Chloride: 104 mmol/L (ref 96–106)
Chol/HDL Ratio: 2.1 ratio (ref 0.0–4.4)
Cholesterol, Total: 145 mg/dL (ref 100–199)
Creatinine, Ser: 0.76 mg/dL (ref 0.57–1.00)
EOS (ABSOLUTE): 0.1 10*3/uL (ref 0.0–0.4)
Eos: 1 %
Estimated CHD Risk: <0.5 times avg. (ref 0.0–1.0)
Free Thyroxine Index: 1.7 (ref 1.2–4.9)
GFR calc Af Amer: 125 mL/min/{1.73_m2} (ref >59)
GFR calc non Af Amer: 109 mL/min/{1.73_m2} (ref >59)
GGT: 26 IU/L (ref 0–60)
Globulin, Total: 2.3 g/dL (ref 1.5–4.5)
Glucose: 80 mg/dL (ref 65–99)
HDL: 68 mg/dL (ref >39)
Hematocrit: 40.2 % (ref 34.0–46.6)
Hemoglobin: 13.6 g/dL (ref 11.1–15.9)
Immature Grans (Abs): 0 10*3/uL (ref 0.0–0.1)
Immature Granulocytes: 0 %
Iron: 56 ug/dL (ref 27–159)
LDH: 333 IU/L — ABNORMAL HIGH (ref 119–226)
LDL Chol Calc (NIH): 67 mg/dL (ref 0–99)
Lymphocytes Absolute: 2 10*3/uL (ref 0.7–3.1)
Lymphs: 27 %
MCH: 32.6 pg (ref 26.6–33.0)
MCHC: 33.8 g/dL (ref 31.5–35.7)
MCV: 96 fL (ref 79–97)
Monocytes Absolute: 0.6 10*3/uL (ref 0.1–0.9)
Monocytes: 8 %
Neutrophils Absolute: 4.5 10*3/uL (ref 1.4–7.0)
Neutrophils: 62 %
Phosphorus: 3.7 mg/dL (ref 3.0–4.3)
Platelets: 264 10*3/uL (ref 150–450)
Potassium: 4.7 mmol/L (ref 3.5–5.2)
RBC: 4.17 x10E6/uL (ref 3.77–5.28)
RDW: 12.3 % (ref 11.7–15.4)
Sodium: 142 mmol/L (ref 134–144)
T3 Uptake Ratio: 26 % (ref 24–39)
T4, Total: 6.6 ug/dL (ref 4.5–12.0)
TSH: 0.976 u[IU]/mL (ref 0.450–4.500)
Total Protein: 6.8 g/dL (ref 6.0–8.5)
Triglycerides: 42 mg/dL (ref 0–149)
Uric Acid: 5.3 mg/dL (ref 2.6–6.2)
VLDL Cholesterol Cal: 10 mg/dL (ref 5–40)
WBC: 7.3 10*3/uL (ref 3.4–10.8)

## 2019-09-21 LAB — VITAMIN D 25 HYDROXY (VIT D DEFICIENCY, FRACTURES): Vit D, 25-Hydroxy: 24.8 ng/mL — ABNORMAL LOW (ref 30.0–100.0)

## 2019-09-21 NOTE — Progress Notes (Signed)
Subjective:    Patient ID: Danielle Burton, female    DOB: 1995/10/10, 24 y.o.   MRN: [id number]  HPI  24 yo female in non acute distress. Returns for 1 week  follow up on Anxiety and Depression. Started on Lexapro 10 mg on Feb 3rd 2021. She states over the past week she has felt better, mostly from" reaching out and getting help". Last visit patient states a disagreement with her significant other brought on feelings of anxiety and depression. She thought maybe they were breaking up. She is still with her as of today. The has a 26 yo " with ADHD which can be frustrating at times". The child attends daycare. Asked if she reviewed her educational material after last visit, she says yes and that it was "helpful".  [employer]Marland Kitchen  Has an appointment with counselor on Monday Sep 26 2019.  Blood pressure 117/68, pulse (!) 55, temperature 97.9 F (36.6 C), temperature source Tympanic, resp. rate 16, weight 147 lb 3.2 oz (66.8 kg), SpO2 100 %.     Review of Systems  Constitutional: Negative for fever.  HENT: Negative for congestion, ear pain, rhinorrhea and sore throat.   Respiratory: Negative for cough and shortness of breath.   Cardiovascular: Negative for chest pain.  Gastrointestinal: Negative for diarrhea.  Psychiatric/Behavioral: Negative.     Prior history of anxiety. No current drug use.    Objective:   Physical Exam Vitals and nursing note reviewed.  Constitutional:      Appearance: Normal appearance.  HENT:     Head: Normocephalic and atraumatic.     Mouth/Throat:     Mouth: Mucous membranes are moist.     Pharynx: Oropharynx is clear.  Eyes:     Extraocular Movements: Extraocular movements intact.     Conjunctiva/sclera: Conjunctivae normal.     Pupils: Pupils are equal, round, and reactive to light.  Cardiovascular:     Rate and Rhythm: Normal rate and regular rhythm.  Pulmonary:     Effort: Pulmonary effort is normal.   Breath sounds: Normal breath sounds.  Musculoskeletal:        General: Normal range of motion.     Cervical back: Normal range of motion and neck supple.  Skin:    General: Skin is warm and dry.     Capillary Refill: Capillary refill takes less than 2 seconds.  Neurological:     General: No focal deficit present.     Mental Status: She is alert and oriented to person, place, and time.  Psychiatric:        Mood and Affect: Mood normal.        Behavior: Behavior normal.        Thought Content: Thought content normal.        Judgment: Judgment normal.    GAD-7 14 previously 21 PHQ-9 12 previously  14 Gait wnl.     Assessment & Plan:  Anxiety and Depression Patient feeling better "since reaching out for help". Will refer for psychiatry for medication management, she is open to the idea. Pending appointment with a counselor through [facility], Monday Sep 26, 2019. Reviewed labs with patient ,Vit D deficiency, recommended D3 2000IU/day recheck in  6 months. Reviewed Tapping with patient by Rodell Perna. Given [facility] Physician Referral line number to establish PCP. Patient to have a  2 week follow up with me, sooner if any concerns.. Patient verbalizes understanding and has no questions at the time of discharge.

## 2019-10-04 ENCOUNTER — Other Ambulatory Visit: Payer: Self-pay

## 2019-10-04 ENCOUNTER — Ambulatory Visit (HOSPITAL_COMMUNITY): Payer: BC Managed Care – PPO | Admitting: Licensed Clinical Social Worker

## 2019-10-05 ENCOUNTER — Ambulatory Visit: Payer: Self-pay | Admitting: Medical

## 2019-10-05 ENCOUNTER — Encounter: Payer: Self-pay | Admitting: Medical

## 2019-10-05 ENCOUNTER — Other Ambulatory Visit: Payer: Self-pay

## 2019-10-05 VITALS — BP 119/68 | HR 60 | Temp 99.0°F | Resp 16 | Wt 153.0 lb

## 2019-10-05 DIAGNOSIS — F32A Depression, unspecified: Secondary | ICD-10-CM

## 2019-10-05 DIAGNOSIS — F329 Major depressive disorder, single episode, unspecified: Secondary | ICD-10-CM

## 2019-10-05 DIAGNOSIS — F419 Anxiety disorder, unspecified: Secondary | ICD-10-CM

## 2019-10-05 NOTE — Progress Notes (Signed)
Subjective:    Patient ID: Danielle Burton, female    DOB: 07/13/1996, 24 y.o.   MRN: [id number]  HPI 24 yo female in non acute distress.  Started on Lexapro on  09/14/2019 for Depression and Anxiety. Patient started  OTC antihistimine , but cannot recall the name for seasonal allergies.  She states she is groggy during the day, loss of energy.  First counselor did not show up for her appointment. Called [facility] again and now has   appointment on Monday with a  [facility] counselor.  March 9th is her psychiatric appointment changed from yesterday  Review of Systems  Constitutional: Negative for chills and fever.  HENT: Negative for ear discharge, postnasal drip, rhinorrhea, sinus pressure, sinus pain and voice change.   Eyes: Negative for visual disturbance.  Respiratory: Negative for chest tightness, shortness of breath and wheezing.   Cardiovascular: Negative for chest pain, palpitations and leg swelling.  Gastrointestinal: Negative for abdominal pain, diarrhea, nausea and vomiting.  Genitourinary: Negative for difficulty urinating, frequency, hematuria and urgency.  Musculoskeletal: Negative for arthralgias and myalgias.  Skin: Negative for rash and wound.  Allergic/Immunologic: Positive for environmental allergies. Negative for food allergies.  Neurological: Negative for dizziness, tremors, seizures, syncope, weakness, numbness and headaches.  Hematological: Negative for adenopathy. Does not bruise/bleed easily.  Psychiatric/Behavioral: Negative for agitation, behavioral problems, confusion, decreased concentration, dysphoric mood, hallucinations, self-injury, sleep disturbance and suicidal ideas. The patient is nervous/anxious (but improved.). The patient is not hyperactive.    Lives together with her significant other. "Things are going well".   Blood pressure 119/68, pulse 60, temperature 99 F (37.2 C), temperature source Tympanic, resp. rate 16, weight 153 lb (69.4 kg), SpO2 99  %.  Allergies  Allergen Reactions  . Sulfa Antibiotics     Unknown, childhood reaction  . Penicillins Rash    Current Outpatient Medications:  .  escitalopram (LEXAPRO) 10 MG tablet, Take 1 tablet (10 mg total) by mouth daily., Disp: 30 tablet, Rfl: 1 A antihistimine she is unsure which one.    Objective:   Physical Exam Constitutional:      Appearance: Normal appearance.  HENT:     Head: Normocephalic and atraumatic.  Eyes:     Extraocular Movements: Extraocular movements intact.     Conjunctiva/sclera: Conjunctivae normal.     Pupils: Pupils are equal, round, and reactive to light.  Pulmonary:     Effort: Pulmonary effort is normal.  Musculoskeletal:        General: Normal range of motion.     Cervical back: Normal range of motion.  Skin:    General: Skin is warm and dry.  Neurological:     General: No focal deficit present.     Mental Status: She is alert and oriented to person, place, and time. Mental status is at baseline.     GCS: GCS eye subscore is 4. GCS verbal subscore is 5. GCS motor subscore is 6.     Cranial Nerves: Cranial nerves are intact.     Sensory: Sensation is intact.     Motor: Motor function is intact.     Coordination: Coordination is intact.     Gait: Gait is intact.  Psychiatric:        Attention and Perception: Attention and perception normal.        Mood and Affect: Mood and affect normal.        Speech: Speech normal.        Behavior: Behavior normal.  Thought Content: Thought content normal.        Cognition and Memory: Cognition and memory normal.        Judgment: Judgment normal.     PHQ -9= 5  GAD-7=6   The numbers above are significantly improved. Assessment & Plan:  Anxiety improving Depression much improved Patient states she is doing much better overall. She states she is ready to talk to a counselor. Avoid a large carbs meals at  lunch Recommended that she take the medications at bedtime. Follow up in  2 weeks,  sooner if any concerns.Recommended she sign up on MyChart. She verbalizes understanding and has no questions at discharge.

## 2019-10-06 ENCOUNTER — Other Ambulatory Visit: Payer: Self-pay | Admitting: Medical

## 2019-10-06 DIAGNOSIS — F419 Anxiety disorder, unspecified: Secondary | ICD-10-CM

## 2019-10-06 DIAGNOSIS — F329 Major depressive disorder, single episode, unspecified: Secondary | ICD-10-CM

## 2019-10-06 DIAGNOSIS — F32A Depression, unspecified: Secondary | ICD-10-CM

## 2019-10-13 ENCOUNTER — Telehealth: Payer: Self-pay | Admitting: Nurse Practitioner

## 2019-10-13 ENCOUNTER — Other Ambulatory Visit: Payer: Self-pay

## 2019-10-13 DIAGNOSIS — F32A Depression, unspecified: Secondary | ICD-10-CM

## 2019-10-13 DIAGNOSIS — F329 Major depressive disorder, single episode, unspecified: Secondary | ICD-10-CM

## 2019-10-13 DIAGNOSIS — F419 Anxiety disorder, unspecified: Secondary | ICD-10-CM

## 2019-10-13 NOTE — Progress Notes (Signed)
Patient ID: Marcelino Scot, female   DOB: 04-08-1996, 24 y.o.   MRN: [id number]  HPI: Samra calls the clinic today requesting RF on medication. Provider set up telemedicine visit with patient who consents to visit for treatment. She reports that she's on Lexapro 10mg  PO and has been taking as directed and tolerating. She reports that her symptoms improved and she denies feeling down, hopeless or depressed and denies anhedonia. She reports her anxiety is improved and continues to worry but only 2 days instead of daily and feels she can function with this. She denies SI/HI and does not feel she needs an adjustment in medication treatment. Upon further review Rx was sent last week by treating provider for 6 months.    ROS: Negative; see HPI  Obj: telemedicine visit  Plan:She reports that she has an appt in the upcoming weeks; which writer verified there were no future appts and she was scheduled today for 3/15. Encouraged to get medication from pharmacy and if there's any issues to give the office a call.

## 2019-10-13 NOTE — Patient Instructions (Signed)
Please get your prescription that was sent in last week. If you have any problems getting it, please call us back. Take as directed and keep your appt with provider

## 2019-10-18 ENCOUNTER — Other Ambulatory Visit: Payer: Self-pay

## 2019-10-18 ENCOUNTER — Ambulatory Visit (INDEPENDENT_AMBULATORY_CARE_PROVIDER_SITE_OTHER): Payer: BC Managed Care – PPO | Admitting: Licensed Clinical Social Worker

## 2019-10-18 DIAGNOSIS — F1121 Opioid dependence, in remission: Secondary | ICD-10-CM

## 2019-10-18 DIAGNOSIS — F1220 Cannabis dependence, uncomplicated: Secondary | ICD-10-CM | POA: Diagnosis not present

## 2019-10-18 DIAGNOSIS — F1020 Alcohol dependence, uncomplicated: Secondary | ICD-10-CM | POA: Diagnosis not present

## 2019-10-18 DIAGNOSIS — F1411 Cocaine abuse, in remission: Secondary | ICD-10-CM

## 2019-10-18 DIAGNOSIS — F1311 Sedative, hypnotic or anxiolytic abuse, in remission: Secondary | ICD-10-CM
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
## 2019-10-24 ENCOUNTER — Other Ambulatory Visit: Payer: Self-pay

## 2019-10-24 ENCOUNTER — Ambulatory Visit: Payer: BC Managed Care – PPO | Admitting: Medical

## 2019-10-24 ENCOUNTER — Encounter: Payer: Self-pay | Admitting: Medical

## 2019-10-24 VITALS — BP 122/73 | HR 52 | Temp 98.2°F | Resp 16 | Wt 152.4 lb

## 2019-10-24 DIAGNOSIS — F32A Depression, unspecified: Secondary | ICD-10-CM

## 2019-10-24 DIAGNOSIS — F329 Major depressive disorder, single episode, unspecified: Secondary | ICD-10-CM

## 2019-10-24 DIAGNOSIS — F419 Anxiety disorder, unspecified: Secondary | ICD-10-CM

## 2019-10-24 NOTE — Progress Notes (Signed)
   Subjective:    Patient ID: Danielle Burton, female    DOB: 27-Oct-1995, 24 y.o.   MRN: [id number]  HPI 24 yo female her for recheck. Seeing 2 [place] Danielle Ridgel Phagan  LCSW and also Danielle Burton LPC, LCAS next appointment  11/01/2019.  Started on Lexapro 10mg  on  09/14/19 Has been on Medication x 6.5 weeks.   She states she is disappointed in herself due to her still drinking. She says she drinks because she is bored and also as a way of coping. She does state that overall her anxiety is better.  Blood pressure 122/73, pulse (!) 52, temperature 98.2 F (36.8 C), temperature source Tympanic, resp. rate 16, weight 152 lb 6.4 oz (69.1 kg), SpO2 99 %.   Review of Systems  Psychiatric/Behavioral: Negative for agitation, behavioral problems, confusion, decreased concentration, hallucinations, self-injury, sleep disturbance and suicidal ideas. The patient is nervous/anxious (feels calmer). The patient is not hyperactive.        Objective:   Physical Exam Vitals and nursing note reviewed.  Constitutional:      Appearance: Normal appearance.  HENT:     Head: Normocephalic and atraumatic.  Eyes:     Extraocular Movements: Extraocular movements intact.     Conjunctiva/sclera: Conjunctivae normal.     Pupils: Pupils are equal, round, and reactive to light.  Cardiovascular:     Rate and Rhythm: Normal rate and regular rhythm.     Heart sounds: Normal heart sounds.  Pulmonary:     Effort: Pulmonary effort is normal.     Breath sounds: Normal breath sounds.  Musculoskeletal:     Cervical back: Normal range of motion.  Skin:    General: Skin is warm and dry.  Neurological:     General: No focal deficit present.     Mental Status: She is alert and oriented to person, place, and time.  Psychiatric:        Mood and Affect: Mood normal.        Behavior: Behavior normal.        Thought Content: Thought content normal.        Judgment: Judgment normal.     Comments: Calm in room         PHQ-9 =11 GAD-7 =8     Assessment & Plan:  Anxiety improved Depression moderate is seeing counselors, taking Lexapro daily. Has support with her girlfriend. One is from  T.Burton is from psychiatry.  Last seen on 10/18/19 with LCSW.Behaviorial health outpatient psychiatry [facility]). I reviewed not to see what the plan is for the patient. My recommendation  Is that psychiatry  take over medication management. Will also give patient information on PCP through [facility] Physician Referral line. Return to clinic in 2 weeks for recheck sooner if any concerns. Patient verbalizes understanding and has no questions at discharge.[facility]

## 2019-10-27 ENCOUNTER — Encounter: Payer: Self-pay | Admitting: Medical

## 2019-11-07 ENCOUNTER — Ambulatory Visit: Payer: BC Managed Care – PPO | Admitting: Medical

## 2019-11-25 ENCOUNTER — Ambulatory Visit (HOSPITAL_COMMUNITY): Payer: BC Managed Care – PPO | Admitting: Licensed Clinical Social Worker

## 2019-11-28 ENCOUNTER — Encounter: Payer: Self-pay | Admitting: Medical

## 2019-11-28 ENCOUNTER — Other Ambulatory Visit: Payer: Self-pay

## 2019-11-28 ENCOUNTER — Ambulatory Visit: Payer: BC Managed Care – PPO | Admitting: Medical

## 2019-11-28 VITALS — BP 139/73 | HR 60 | Temp 98.7°F | Resp 16 | Wt 152.2 lb

## 2019-11-28 DIAGNOSIS — F419 Anxiety disorder, unspecified: Secondary | ICD-10-CM
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
## 2019-11-29 ENCOUNTER — Encounter: Payer: Self-pay | Admitting: Medical

## 2019-12-13 ENCOUNTER — Ambulatory Visit (HOSPITAL_COMMUNITY): Payer: BC Managed Care – PPO | Admitting: Licensed Clinical Social Worker

## 2020-04-02 ENCOUNTER — Other Ambulatory Visit: Payer: Self-pay | Admitting: Medical

## 2020-04-02 DIAGNOSIS — F419 Anxiety disorder, unspecified: Secondary | ICD-10-CM

## 2020-04-02 DIAGNOSIS — F32A Depression, unspecified: Secondary | ICD-10-CM

## 2020-06-26 ENCOUNTER — Other Ambulatory Visit: Payer: Self-pay

## 2020-06-26 ENCOUNTER — Emergency Department
Admission: EM | Admit: 2020-06-26 | Discharge: 2020-06-26 | Disposition: A | Discharge status: Home / Self Care | Payer: Self-pay | Attending: Emergency Medicine | Admitting: Emergency Medicine

## 2020-06-26 DIAGNOSIS — Z87891 Personal history of nicotine dependence: Secondary | ICD-10-CM | POA: Insufficient documentation

## 2020-06-26 DIAGNOSIS — Z79899 Other long term (current) drug therapy: Secondary | ICD-10-CM | POA: Insufficient documentation

## 2020-06-26 DIAGNOSIS — K2100 Gastro-esophageal reflux disease with esophagitis, without bleeding: Secondary | ICD-10-CM | POA: Insufficient documentation

## 2020-06-26 DIAGNOSIS — K529 Noninfective gastroenteritis and colitis, unspecified: Secondary | ICD-10-CM

## 2020-06-26 LAB — CBC WITH DIFFERENTIAL/PLATELET
Abs Immature Granulocytes: 0.03 10*3/uL (ref 0.00–0.07)
Basophils Absolute: 0.1 10*3/uL (ref 0.0–0.1)
Basophils Relative: 1 %
Eosinophils Absolute: 0 10*3/uL (ref 0.0–0.5)
Eosinophils Relative: 0 %
HCT: 43.2 % (ref 36.0–46.0)
Hemoglobin: 14.8 g/dL (ref 12.0–15.0)
Immature Granulocytes: 0 %
Lymphocytes Relative: 18 %
Lymphs Abs: 1.7 10*3/uL (ref 0.7–4.0)
MCH: 32.6 pg (ref 26.0–34.0)
MCHC: 34.3 g/dL (ref 30.0–36.0)
MCV: 95.2 fL (ref 80.0–100.0)
Monocytes Absolute: 0.5 10*3/uL (ref 0.1–1.0)
Monocytes Relative: 5 %
Neutro Abs: 7.3 10*3/uL (ref 1.7–7.7)
Neutrophils Relative %: 76 %
Platelets: 298 10*3/uL (ref 150–400)
RBC: 4.54 MIL/uL (ref 3.87–5.11)
RDW: 12.1 % (ref 11.5–15.5)
WBC: 9.6 10*3/uL (ref 4.0–10.5)
nRBC: 0 % (ref 0.0–0.2)

## 2020-06-26 LAB — COMPREHENSIVE METABOLIC PANEL
ALT: 19 U/L (ref 0–44)
AST: 28 U/L (ref 15–41)
Albumin: 4.3 g/dL (ref 3.5–5.0)
Alkaline Phosphatase: 56 U/L (ref 38–126)
Anion gap: 12 (ref 5–15)
BUN: 11 mg/dL (ref 6–20)
CO2: 23 mmol/L (ref 22–32)
Calcium: 8.8 mg/dL — ABNORMAL LOW (ref 8.9–10.3)
Chloride: 102 mmol/L (ref 98–111)
Creatinine, Ser: 0.72 mg/dL (ref 0.44–1.00)
GFR, Estimated: >60 mL/min (ref >60)
Glucose, Bld: 115 mg/dL — ABNORMAL HIGH (ref 70–99)
Potassium: 3.5 mmol/L (ref 3.5–5.1)
Sodium: 137 mmol/L (ref 135–145)
Total Bilirubin: 0.7 mg/dL (ref 0.3–1.2)
Total Protein: 7.7 g/dL (ref 6.5–8.1)

## 2020-06-26 LAB — HCG, QUANTITATIVE, PREGNANCY: hCG, Beta Chain, Quant, S: <1 m[IU]/mL (ref <5)

## 2020-06-26 LAB — LIPASE, BLOOD: Lipase: 20 U/L (ref 11–51)

## 2020-06-26 MED ORDER — LIDOCAINE VISCOUS HCL 2 % MT SOLN
15.0000 mL | Freq: Once | OROMUCOSAL | Status: AC
  Administered 2020-06-26: 15 mL via ORAL
  Filled 2020-06-26: qty 15

## 2020-06-26 MED ORDER — ALUM & MAG HYDROXIDE-SIMETH 200-200-20 MG/5ML PO SUSP
15.0000 mL | Freq: Once | ORAL | Status: AC
  Administered 2020-06-26: 15 mL via ORAL
  Filled 2020-06-26: qty 30

## 2020-06-26 MED ORDER — ONDANSETRON 4 MG PO TBDP: 4.0000 mg | ORAL_TABLET | Freq: Three times a day (TID) | ORAL | 0 refills | Status: DC | PRN

## 2020-06-26 MED ORDER — OMEPRAZOLE 20 MG PO CPDR: 20.0000 mg | DELAYED_RELEASE_CAPSULE | Freq: Every day | ORAL | 1 refills | Status: DC

## 2020-06-26 MED ORDER — LACTATED RINGERS IV BOLUS
1000.0000 mL | Freq: Once | INTRAVENOUS | Status: AC
  Administered 2020-06-26: 1000 mL via INTRAVENOUS

## 2020-06-26 MED ORDER — ONDANSETRON HCL 4 MG/2ML IJ SOLN
4.0000 mg | Freq: Once | INTRAMUSCULAR | Status: AC
  Administered 2020-06-26: 4 mg via INTRAVENOUS
  Filled 2020-06-26: qty 2

## 2020-06-26 NOTE — ED Provider Notes (Signed)
[facility] Emergency Department Provider Note   ____________________________________________   First MD Initiated Contact with Patient 06/26/20 1110     (approximate)  I have reviewed the triage vital signs and the nursing notes.   HISTORY  Chief Complaint Abdominal Pain, Emesis, and Diarrhea    HPI Danielle Burton is a 24 y.o. female with past medical history of generalized anxiety disorder, alcohol abuse, and polysubstance abuse who presents to the ED complaining of abdominal pain, vomiting, and diarrhea.  Patient reports that she has had 3 days of nausea, vomiting, and diarrhea along with upper abdominal pain.  She describes the pain as a burning that can move up into her chest.  It is similar to episodes of pain that she has had in the past, she states she previously saw a GI doctor for chronic abdominal issues, but they were not able to figure things out and so she has not seen them in some time.  She reports her stools have been dark at times, but she denies any blood in her stool.  She has had issues with alcohol abuse and substance abuse in the past, but states this is now been an issue for her for multiple months.        No past medical history on file.  Patient Active Problem List   Diagnosis Date Noted  . Abdominal pain, RLQ (right lower quadrant) 09/14/2019  . Alcohol abuse 12/08/2017  . Substance induced mood disorder (HCC) 12/08/2017  . Benzodiazepine abuse (HCC) 12/08/2017  . GAD (generalized anxiety disorder) 10/20/2017  . Moderate episode of recurrent major depressive disorder (HCC) 10/20/2017    No past surgical history on file.  Prior to Admission medications   Medication Sig Start Date End Date Taking? Authorizing Provider  escitalopram (LEXAPRO) 10 MG tablet TAKE 1 TABLET BY MOUTH EVERY DAY 10/07/19   Ratcliffe, Heather R, PA-C  omeprazole (PRILOSEC) 20 MG capsule Take 1 capsule (20 mg total) by mouth daily. 06/26/20 06/26/21   Chesley Noon, MD  ondansetron (ZOFRAN ODT) 4 MG disintegrating tablet Take 1 tablet (4 mg total) by mouth every 8 (eight) hours as needed for nausea or vomiting. 06/26/20   Chesley Noon, MD    Allergies Sulfa antibiotics and Penicillins  No family history on file.  Social History Social History   Tobacco Use  . Smoking status: Former Smoker    Packs/day: 0.50    Types: Cigarettes  . Smokeless tobacco: Never Used  Substance Use Topics  . Alcohol use: Yes  . Drug use: No    Review of Systems  Constitutional: No fever/chills Eyes: No visual changes. ENT: No sore throat. Cardiovascular: Denies chest pain. Respiratory: Denies shortness of breath. Gastrointestinal: Positive for abdominal pain, nausea, vomiting, and diarrhea.  No constipation. Genitourinary: Negative for dysuria. Musculoskeletal: Negative for back pain. Skin: Negative for rash. Neurological: Negative for headaches, focal weakness or numbness.  ____________________________________________   PHYSICAL EXAM:  VITAL SIGNS: ED Triage Vitals  Enc Vitals Group     BP 06/26/20 1104 (!) 159/93     Pulse Rate 06/26/20 1104 88     Resp 06/26/20 1104 18     Temp 06/26/20 1104 99.4 F (37.4 C)     Temp Source 06/26/20 1104 Oral     SpO2 06/26/20 1104 98 %     Weight 06/26/20 1105 145 lb (65.8 kg)     Height 06/26/20 1105 5\' 2"  (1.575 m)     Head Circumference --  Peak Flow --      Pain Score 06/26/20 1105 6     Pain Loc --      Pain Edu? --      Excl. in GC? --     Constitutional: Alert and oriented. Eyes: Conjunctivae are normal. Head: Atraumatic. Nose: No congestion/rhinnorhea. Mouth/Throat: Mucous membranes are moist. Neck: Normal ROM Cardiovascular: Normal rate, regular rhythm. Grossly normal heart sounds. Respiratory: Normal respiratory effort.  No retractions. Lungs CTAB. Gastrointestinal: Soft and nontender. No distention. Genitourinary: deferred Musculoskeletal: No lower extremity  tenderness nor edema. Neurologic:  Normal speech and language. No gross focal neurologic deficits are appreciated. Skin:  Skin is warm, dry and intact. No rash noted. Psychiatric: Mood and affect are normal. Speech and behavior are normal.  ____________________________________________   LABS (all labs ordered are listed, but only abnormal results are displayed)  Labs Reviewed  COMPREHENSIVE METABOLIC PANEL - Abnormal; Notable for the following components:      Result Value   Glucose, Bld 115 (*)    Calcium 8.8 (*)    All other components within normal limits  CBC WITH DIFFERENTIAL/PLATELET  LIPASE, BLOOD  HCG, QUANTITATIVE, PREGNANCY  URINALYSIS, COMPLETE (UACMP) WITH MICROSCOPIC  POC URINE PREG, ED     PROCEDURES  Procedure(s) performed (including Critical Care):  Procedures   ____________________________________________   INITIAL IMPRESSION / ASSESSMENT AND PLAN / ED COURSE       24 year old female with past medical history of alcohol abuse, generalized anxiety disorder, and substance abuse who presents to the ED complaining of burning upper abdominal pain, vomiting, and diarrhea for the past 3 days.  She has no focal tenderness on exam and she describes pain as a burning discomfort similar to what she has dealt with in the past.  Symptoms sound most consistent with gastroenteritis with associated gastritis/GERD and we will hold off on CT scan for now.  We will check labs including LFTs and lipase, also check urine pregnancy and UA.  We will hydrate patient with IV fluids, she declines pain or nausea medication at this time but we will give GI cocktail and reassess.  Lab work is unremarkable, pregnancy testing is negative.  Patient has been unable to provide urine sample but denies any urinary symptoms and I doubt UTI.  She reports feeling slightly better following Zofran and GI cocktail, has been tolerating p.o. without difficulty here in the ED.  She is appropriate for  discharge home with PCP and GI follow-up.  She was prescribed Zofran as well as omeprazole, counseled to return to the ED for new worsening symptoms.  Patient agrees to plan.      ____________________________________________   FINAL CLINICAL IMPRESSION(S) / ED DIAGNOSES  Final diagnoses:  Gastroesophageal reflux disease with esophagitis without hemorrhage  Gastroenteritis     ED Discharge Orders         Ordered    ondansetron (ZOFRAN ODT) 4 MG disintegrating tablet  Every 8 hours PRN        06/26/20 1535    omeprazole (PRILOSEC) 20 MG capsule  Daily        06/26/20 1536           Note:  This document was prepared using Dragon voice recognition software and may include unintentional dictation errors.   Chesley Noon, MD 06/26/20 1537

## 2020-06-26 NOTE — ED Triage Notes (Signed)
Pt reports 3 days of n/v/d, reports approx 4 episodes a day, states that her stools have been very dark, hx of gi issues and ulcers, reports doing a lot of drugs when she was younger and drinking bleach in the past, reports that she is having burning sensation in her stomach today

## 2022-01-01 ENCOUNTER — Encounter
Admission: EM | Disposition: A | Discharge status: Home / Self Care | Payer: Self-pay | Source: Home / Self Care | Attending: Emergency Medicine

## 2022-01-01 ENCOUNTER — Observation Stay: Payer: BLUE CROSS/BLUE SHIELD | Admitting: Anesthesiology

## 2022-01-01 ENCOUNTER — Emergency Department: Payer: BLUE CROSS/BLUE SHIELD

## 2022-01-01 ENCOUNTER — Observation Stay
Admission: EM | Admit: 2022-01-01 | Discharge: 2022-01-01 | Disposition: A | Discharge status: Home / Self Care | Payer: BLUE CROSS/BLUE SHIELD | Attending: Osteopathic Medicine | Admitting: Osteopathic Medicine

## 2022-01-01 ENCOUNTER — Other Ambulatory Visit: Payer: Self-pay

## 2022-01-01 DIAGNOSIS — F1010 Alcohol abuse, uncomplicated: Secondary | ICD-10-CM | POA: Diagnosis present

## 2022-01-01 DIAGNOSIS — K2970 Gastritis, unspecified, without bleeding: Secondary | ICD-10-CM | POA: Insufficient documentation

## 2022-01-01 DIAGNOSIS — Z87891 Personal history of nicotine dependence: Secondary | ICD-10-CM | POA: Diagnosis not present

## 2022-01-01 DIAGNOSIS — R0789 Other chest pain: Secondary | ICD-10-CM | POA: Insufficient documentation

## 2022-01-01 DIAGNOSIS — K920 Hematemesis: Secondary | ICD-10-CM | POA: Diagnosis present

## 2022-01-01 DIAGNOSIS — R079 Chest pain, unspecified: Secondary | ICD-10-CM

## 2022-01-01 HISTORY — DX: Gastro-esophageal reflux disease without esophagitis: K21.9

## 2022-01-01 HISTORY — PX: ESOPHAGOGASTRODUODENOSCOPY (EGD) WITH PROPOFOL: SHX5813

## 2022-01-01 LAB — COMPREHENSIVE METABOLIC PANEL
ALT: 19 U/L (ref 0–44)
AST: 30 U/L (ref 15–41)
Albumin: 4.7 g/dL (ref 3.5–5.0)
Alkaline Phosphatase: 38 U/L (ref 38–126)
Anion gap: 11 (ref 5–15)
BUN: 12 mg/dL (ref 6–20)
CO2: 25 mmol/L (ref 22–32)
Calcium: 9.1 mg/dL (ref 8.9–10.3)
Chloride: 103 mmol/L (ref 98–111)
Creatinine, Ser: 0.81 mg/dL (ref 0.44–1.00)
GFR, Estimated: >60 mL/min (ref >60)
Glucose, Bld: 137 mg/dL — ABNORMAL HIGH (ref 70–99)
Potassium: 3.6 mmol/L (ref 3.5–5.1)
Sodium: 139 mmol/L (ref 135–145)
Total Bilirubin: 0.8 mg/dL (ref 0.3–1.2)
Total Protein: 7.7 g/dL (ref 6.5–8.1)

## 2022-01-01 LAB — TYPE AND SCREEN
ABO/RH(D): O POS
Antibody Screen: NEGATIVE

## 2022-01-01 LAB — CBC
HCT: 40.7 % (ref 36.0–46.0)
Hemoglobin: 13.7 g/dL (ref 12.0–15.0)
MCH: 32.3 pg (ref 26.0–34.0)
MCHC: 33.7 g/dL (ref 30.0–36.0)
MCV: 96 fL (ref 80.0–100.0)
Platelets: 275 10*3/uL (ref 150–400)
RBC: 4.24 MIL/uL (ref 3.87–5.11)
RDW: 12 % (ref 11.5–15.5)
WBC: 5.1 10*3/uL (ref 4.0–10.5)
nRBC: 0 % (ref 0.0–0.2)

## 2022-01-01 LAB — TROPONIN I (HIGH SENSITIVITY): Troponin I (High Sensitivity): <2 ng/L (ref <18)

## 2022-01-01 LAB — HCG, QUANTITATIVE, PREGNANCY: hCG, Beta Chain, Quant, S: <1 m[IU]/mL (ref <5)

## 2022-01-01 SURGERY — ESOPHAGOGASTRODUODENOSCOPY (EGD) WITH PROPOFOL
Anesthesia: General

## 2022-01-01 MED ORDER — OMEPRAZOLE 40 MG PO CPDR: 40.0000 mg | DELAYED_RELEASE_CAPSULE | Freq: Every day | ORAL | 0 refills | Status: AC

## 2022-01-01 MED ORDER — ONDANSETRON 4 MG PO TBDP: 4.0000 mg | ORAL_TABLET | Freq: Three times a day (TID) | ORAL | 0 refills | Status: AC | PRN

## 2022-01-01 MED ORDER — PROPOFOL 10 MG/ML IV BOLUS
INTRAVENOUS | Status: DC | PRN
  Administered 2022-01-01: 20 mg via INTRAVENOUS
  Administered 2022-01-01 (×2): 40 mg via INTRAVENOUS
  Administered 2022-01-01: 30 mg via INTRAVENOUS
  Administered 2022-01-01: 70 mg via INTRAVENOUS

## 2022-01-01 MED ORDER — PROPOFOL 500 MG/50ML IV EMUL
INTRAVENOUS | Status: DC | PRN
  Administered 2022-01-01: 200 ug/kg/min via INTRAVENOUS

## 2022-01-01 MED ORDER — DEXMEDETOMIDINE HCL 200 MCG/2ML IV SOLN
INTRAVENOUS | Status: DC | PRN
  Administered 2022-01-01: 12 ug via INTRAVENOUS

## 2022-01-01 MED ORDER — SODIUM CHLORIDE 0.9 % IV BOLUS
1000.0000 mL | Freq: Once | INTRAVENOUS | Status: AC
  Administered 2022-01-01: 1000 mL via INTRAVENOUS

## 2022-01-01 MED ORDER — SODIUM CHLORIDE 0.9 % IV SOLN
INTRAVENOUS | Status: DC
  Administered 2022-01-01: 1000 mL via INTRAVENOUS

## 2022-01-01 MED ORDER — PANTOPRAZOLE SODIUM 40 MG IV SOLR: 40.0000 mg | Freq: Two times a day (BID) | INTRAVENOUS | Status: DC

## 2022-01-01 MED ORDER — PANTOPRAZOLE SODIUM 40 MG IV SOLR
40.0000 mg | Freq: Once | INTRAVENOUS | Status: AC
  Administered 2022-01-01: 40 mg via INTRAVENOUS
  Filled 2022-01-01: qty 10

## 2022-01-01 MED ORDER — LIDOCAINE HCL (CARDIAC) PF 100 MG/5ML IV SOSY
PREFILLED_SYRINGE | INTRAVENOUS | Status: DC | PRN
  Administered 2022-01-01: 100 mg via INTRAVENOUS

## 2022-01-01 NOTE — ED Notes (Signed)
Pt ambulated to bathroom in hall with steady gait.

## 2022-01-01 NOTE — H&P (Signed)
History and Physical    Patient: Danielle Burton WUJ:[id number] DOB: 12/31/1995 DOA: 01/01/2022 DOS: the patient was seen and examined on 01/01/2022 PCP: Pcp, No  Patient coming from: Home  Today is hospital day 0 after presenting to ED on 01/01/2022  9:51 AM with  Chief Complaint  Patient presents with   Chest Pain   Hematemesis     RECORD REVIEW AND HOSPITAL COURSE: 01/01/22 presented to ED complaining of chest pain, hematemesis, ongoing for 3 days brought up a "quarter size" amount of blood over 2 episodes of vomiting this morning, reports associated nausea, reports normal bowel movements without bloody/dark stool.  Describes sharp feeling on left side of chest, no associated SOB/pleuritic chest pain, no fever/cough.  PMH GERD, not on any home medications, EtOH use describing as much as 1/5 of liquor daily in the past and lately closer to 4-5 shots per night, last drink yesterday evening.  In ED, vitals and CBC stable, ED physician spoke with gastroenterology and per his report they are planning for EGD hopefully later today.  Patient was given Protonix IV, kept NPO.  Procedures and Significant Results:  None as of admission  Consultants:  Gastroenterology consulted by ED physician     SUBJECTIVE:  Patient confirms the above history, states that she has not had any additional vomiting, still feeling a bit nauseous.     ASSESSMENT & PLAN  Hematemesis most likely GI bleed with underlying EtOH use, GERD Given symptoms and risk stratification, unlikely PE Gastroenterology consulted by emergency department physician IV Protonix N.p.o. Per ED physician, GI is planning for scope today Patient is placed under observation, pending EGD findings/GI recommendations, may be able to be discharged home today or tomorrow  Alcohol abuse Discussed abstinence with patient Of note, patient has specifically requested that medical staff do not mention EtOH use around her family, will  honor this request    VTE Ppx: early ambulation CODE STATUS: FULL Admitted from: home Expected Dispo: home Barriers to discharge: continued medical workup - GI for EGD planned likely today 01/01/22  Family communication: family ad bedside in ED                 Chief Complaint:  Chief Complaint  Patient presents with   Chest Pain   Hematemesis    Review of Systems: As mentioned in the history of present illness. All other systems reviewed and are negative. Past Medical History:  Diagnosis Date   GERD (gastroesophageal reflux disease)    History reviewed. No pertinent surgical history. Social History:  reports that she has quit smoking. Her smoking use included cigarettes. She smoked an average of .5 packs per day. She has never used smokeless tobacco. She reports current alcohol use of about 5.0 standard drinks per week. She reports that she does not use drugs.  Allergies  Allergen Reactions   Sulfa Antibiotics     Unknown, childhood reaction   Penicillins Rash    No family history on file.  Prior to Admission medications   Medication Sig Start Date End Date Taking? Authorizing Provider  escitalopram (LEXAPRO) 10 MG tablet TAKE 1 TABLET BY MOUTH EVERY DAY Patient not taking: Reported on 01/01/2022 10/07/19   Doy Minceatcliffe, Heather R, PA-C  omeprazole (PRILOSEC) 20 MG capsule Take 1 capsule (20 mg total) by mouth daily. 06/26/20 06/26/21  Chesley NoonJessup, Charles, MD  ondansetron (ZOFRAN ODT) 4 MG disintegrating tablet Take 1 tablet (4 mg total) by mouth every 8 (eight) hours as needed for nausea  or vomiting. Patient not taking: Reported on 01/01/2022 06/26/20   Chesley Noon, MD    Physical Exam: Vitals:   01/01/22 0849 01/01/22 1005 01/01/22 1100 01/01/22 1130  BP: (!) 144/71 133/73 120/80 122/75  Pulse: 75 76 (!) 46 70  Resp: 18 14 14 15   Temp: 98.5 F (36.9 C)     TempSrc: Oral     SpO2: 99% 98% 99% 97%  Constitutional:  VSS, see nurse notes General  Appearance: alert, well-developed, well-nourished, NAD Eyes: Normal lids and conjunctive, non-icteric sclera Ears, Nose, Mouth, Throat: Normal appearance, MMM Neck: No masses, trachea midline Respiratory: Normal respiratory effort Breath sounds normal, no wheeze/rhonchi/rales Cardiovascular: S1/S2 normal, no murmur/rub/gallop auscultated No lower extremity edema Gastrointestinal: TTP epigastrum, no rebound/guarding no masses BS WNLx4 Musculoskeletal:  Gait normal No clubbing/cyanosis of digits Neurological: No cranial nerve deficit on limited exam Motor intact and symmetric extremities  Psychiatric: Normal judgment/insight Normal mood and affect     Data Reviewed:  Results for orders placed or performed during the hospital encounter of 01/01/22 (from the past 72 hour(s))  Comprehensive metabolic panel     Status: Abnormal   Collection Time: 01/01/22  8:50 AM  Result Value Ref Range   Sodium 139 135 - 145 mmol/L   Potassium 3.6 3.5 - 5.1 mmol/L   Chloride 103 98 - 111 mmol/L   CO2 25 22 - 32 mmol/L   Glucose, Bld 137 (H) 70 - 99 mg/dL    Comment: Glucose reference range applies only to samples taken after fasting for at least 8 hours.   BUN 12 6 - 20 mg/dL   Creatinine, Ser 01/03/22 0.44 - 1.00 mg/dL   Calcium 9.1 8.9 - 3.23 mg/dL   Total Protein 7.7 6.5 - 8.1 g/dL   Albumin 4.7 3.5 - 5.0 g/dL   AST 30 15 - 41 U/L   ALT 19 0 - 44 U/L   Alkaline Phosphatase 38 38 - 126 U/L   Total Bilirubin 0.8 0.3 - 1.2 mg/dL   GFR, Estimated 55.7 >32 mL/min    Comment: (NOTE) Calculated using the CKD-EPI Creatinine Equation (2021)    Anion gap 11 5 - 15    Comment: Performed at [facility], [address] [place], [place]  CBC     Status: None   Collection Time: 01/01/22  8:50 AM  Result Value Ref Range   WBC 5.1 4.0 - 10.5 K/uL   RBC 4.24 3.87 - 5.11 MIL/uL   Hemoglobin 13.7 12.0 - 15.0 g/dL   HCT 01/03/22 06.2 - 37.6 %   MCV 96.0 80.0 - 100.0 fL   MCH  32.3 26.0 - 34.0 pg   MCHC 33.7 30.0 - 36.0 g/dL   RDW 28.3 15.1 - 76.1 %   Platelets 275 150 - 400 K/uL   nRBC 0.0 0.0 - 0.2 %    Comment: Performed at [facility], [address] [place], [place]  Type and screen [facility]     Status: None   Collection Time: 01/01/22  8:50 AM  Result Value Ref Range   ABO/RH(D) O POS    Antibody Screen NEG    Sample Expiration      01/04/2022,2359 Performed at [facility], [address] [place], [place]   Troponin I (High Sensitivity)     Status: None   Collection Time: 01/01/22  8:50 AM  Result Value Ref Range   Troponin I (High Sensitivity) <2 <  18 ng/L    Comment: (NOTE) Elevated high sensitivity troponin I (hsTnI) values and significant  changes across serial measurements may suggest ACS but many other  chronic and acute conditions are known to elevate hsTnI results.  Refer to the "Links" section for chest pain algorithms and additional  guidance. Performed at [facility], [address] [place], [place] [address]   hCG, quantitative, pregnancy     Status: None   Collection Time: 01/01/22 11:39 AM  Result Value Ref Range   hCG, Beta Chain, Quant, S <1 <5 mIU/mL    Comment:          GEST. AGE      CONC.  (mIU/mL)   <=1 WEEK        5 - 50     2 WEEKS       50 - 500     3 WEEKS       100 - 10,000     4 WEEKS     1,000 - 30,000     5 WEEKS     3,500 - 115,000   6-8 WEEKS     12,000 - 270,000    12 WEEKS     15,000 - 220,000        FEMALE AND NON-PREGNANT FEMALE:     LESS THAN 5 mIU/mL Performed at [facility], [address], [place], [place] [address]     DG Chest 2 View  Result Date: 01/01/2022 CLINICAL DATA:  Chest pain. EXAM: CHEST - 2 VIEW COMPARISON:  Chest radiograph December 07, 2017. FINDINGS: No consolidation. No visible pleural effusions or pneumothorax. Cardiomediastinal silhouette is within normal limits and similar to prior. No  displaced fracture. IMPRESSION: No evidence of acute cardiopulmonary disease. Electronically Signed   By: Feliberto Harts M.D.   On: 01/01/2022 10:35    Severity of Illness: The appropriate patient status for this patient is OBSERVATION. Observation status is judged to be reasonable and necessary in order to provide the required intensity of service to ensure the patient's safety. The patient's presenting symptoms, physical exam findings, and initial radiographic and laboratory data in the context of their medical condition is felt to place them at decreased risk for further clinical deterioration. Furthermore, it is anticipated that the patient will be medically stable for discharge from the hospital within 2 midnights of admission.   Author: Sunnie Nielsen, DO 01/01/2022 12:35 PM  For on call review [email or web address].

## 2022-01-01 NOTE — Transfer of Care (Signed)
Immediate Anesthesia Transfer of Care Note  Patient: Katena N Patriarca  Procedure(s) Performed: ESOPHAGOGASTRODUODENOSCOPY (EGD) WITH PROPOFOL  Patient Location: Endoscopy Unit  Anesthesia Type:General  Level of Consciousness: drowsy  Airway & Oxygen Therapy: Patient Spontanous Breathing  Post-op Assessment: Report given to RN and Post -op Vital signs reviewed and stable  Post vital signs: Reviewed and stable  Last Vitals:  Vitals Value Taken Time  BP    Temp    Pulse 74 01/01/22 1548  Resp 3 01/01/22 1548  SpO2 99 % 01/01/22 1548  Vitals shown include unvalidated device data.  Last Pain:  Vitals:   01/01/22 1458  TempSrc:   PainSc: 0-No pain      Patients Stated Pain Goal: 0 ([id number])  Complications: No notable events documented.

## 2022-01-01 NOTE — Plan of Care (Signed)
  Problem: Education: Goal: Knowledge of General Education information will improve Description: Including pain rating scale, medication(s)/side effects and non-pharmacologic comfort measures 01/01/2022 1744 by Orvan Seen, RN Outcome: Completed/Met 01/01/2022 1743 by Orvan Seen, RN Outcome: Progressing   Problem: Health Behavior/Discharge Planning: Goal: Ability to manage health-related needs will improve 01/01/2022 1744 by Orvan Seen, RN Outcome: Completed/Met 01/01/2022 1743 by Orvan Seen, RN Outcome: Progressing   Problem: Clinical Measurements: Goal: Ability to maintain clinical measurements within normal limits will improve 01/01/2022 1744 by Orvan Seen, RN Outcome: Completed/Met 01/01/2022 1743 by Orvan Seen, RN Outcome: Progressing Goal: Will remain free from infection 01/01/2022 1744 by Orvan Seen, RN Outcome: Completed/Met 01/01/2022 1743 by Orvan Seen, RN Outcome: Progressing Goal: Diagnostic test results will improve 01/01/2022 1744 by Orvan Seen, RN Outcome: Completed/Met 01/01/2022 1743 by Orvan Seen, RN Outcome: Progressing Goal: Respiratory complications will improve 01/01/2022 1744 by Orvan Seen, RN Outcome: Completed/Met 01/01/2022 1743 by Orvan Seen, RN Outcome: Progressing Goal: Cardiovascular complication will be avoided 01/01/2022 1744 by Orvan Seen, RN Outcome: Completed/Met 01/01/2022 1743 by Orvan Seen, RN Outcome: Progressing   Problem: Activity: Goal: Risk for activity intolerance will decrease 01/01/2022 1744 by Orvan Seen, RN Outcome: Completed/Met 01/01/2022 1743 by Orvan Seen, RN Outcome: Progressing   Problem: Nutrition: Goal: Adequate nutrition will be maintained 01/01/2022 1744 by Orvan Seen, RN Outcome: Completed/Met 01/01/2022 1743 by Orvan Seen, RN Outcome: Progressing   Problem: Coping: Goal: Level of anxiety will decrease 01/01/2022  1744 by Orvan Seen, RN Outcome: Completed/Met 01/01/2022 1743 by Orvan Seen, RN Outcome: Progressing   Problem: Elimination: Goal: Will not experience complications related to bowel motility 01/01/2022 1744 by Orvan Seen, RN Outcome: Completed/Met 01/01/2022 1743 by Orvan Seen, RN Outcome: Progressing Goal: Will not experience complications related to urinary retention 01/01/2022 1744 by Orvan Seen, RN Outcome: Completed/Met 01/01/2022 1743 by Orvan Seen, RN Outcome: Progressing   Problem: Pain Managment: Goal: General experience of comfort will improve 01/01/2022 1744 by Orvan Seen, RN Outcome: Completed/Met 01/01/2022 1743 by Orvan Seen, RN Outcome: Progressing   Problem: Safety: Goal: Ability to remain free from injury will improve 01/01/2022 1744 by Orvan Seen, RN Outcome: Completed/Met 01/01/2022 1743 by Orvan Seen, RN Outcome: Progressing   Problem: Skin Integrity: Goal: Risk for impaired skin integrity will decrease 01/01/2022 1744 by Orvan Seen, RN Outcome: Completed/Met 01/01/2022 1743 by Orvan Seen, RN Outcome: Progressing

## 2022-01-01 NOTE — Anesthesia Preprocedure Evaluation (Addendum)
Anesthesia Evaluation  Patient identified by MRN, date of birth, ID band Patient awake    Reviewed: Allergy & Precautions, H&P , NPO status , Patient's Chart, lab work & pertinent test results, reviewed documented beta blocker date and time   History of Anesthesia Complications Negative for: history of anesthetic complications  Airway Mallampati: II  TM Distance: >3 FB Neck ROM: full    Dental  (+) Dental Advidsory Given   Pulmonary neg pulmonary ROS, former smoker,    Pulmonary exam normal breath sounds clear to auscultation       Cardiovascular Exercise Tolerance: Good negative cardio ROS Normal cardiovascular exam Rhythm:regular Rate:Normal  ECG 01/01/22: Normal sinus rhythm with sinus arrhythmia Cannot rule out Anterior infarct , age undetermined   Neuro/Psych PSYCHIATRIC DISORDERS Anxiety Depression Alcohol use disorder negative neurological ROS  negative psych ROS   GI/Hepatic Neg liver ROS, GERD  ,  Endo/Other  negative endocrine ROS  Renal/GU negative Renal ROS  negative genitourinary   Musculoskeletal   Abdominal   Peds  Hematology negative hematology ROS (+)   Anesthesia Other Findings Past Medical History: No date: GERD (gastroesophageal reflux disease)   Reproductive/Obstetrics negative OB ROS                            Anesthesia Physical Anesthesia Plan  ASA: 2  Anesthesia Plan: General   Post-op Pain Management:    Induction: Intravenous  PONV Risk Score and Plan: 3 and Propofol infusion, TIVA and Treatment may vary due to age or medical condition  Airway Management Planned: Natural Airway  Additional Equipment:   Intra-op Plan:   Post-operative Plan:   Informed Consent: I have reviewed the patients History and Physical, chart, labs and discussed the procedure including the risks, benefits and alternatives for the proposed anesthesia with the patient or  authorized representative who has indicated his/her understanding and acceptance.     Dental Advisory Given  Plan Discussed with: Anesthesiologist, CRNA and Surgeon  Anesthesia Plan Comments:        Anesthesia Quick Evaluation

## 2022-01-01 NOTE — Progress Notes (Signed)
Patient discharged to home accompanied by mother and wheeled out by transport with all belongings. A+Ox4. VSS. PIV x1 removed, no bleeding, intact. Medications and discharge instructions reviewed - including diagnosis information (Gastritis), diet recommendations, Alcohol cessation and withdrawal information. Patient agreed to follow up with PCP if needed. All questions answered. Patient satisfied with overall care at [facility].

## 2022-01-01 NOTE — Op Note (Signed)
[facility] Gastroenterology Patient Name: Danielle Burton Procedure Date: 01/01/2022 2:19 PM MRN: [id number] Account #: [id number] Date of Birth: [birth year] Admit Type: Inpatient Age: 26 Room: [facility] ENDO ROOM 2 Gender: Female Note Status: Finalized Instrument Name: Michaelle Birks [id number] Procedure:             Upper GI endoscopy Indications:           Hematemesis Providers:             Andrey Farmer MD, MD Referring MD:          No PCP Medicines:             Monitored Anesthesia Care Complications:         No immediate complications. Procedure:             Pre-Anesthesia Assessment:                        - Prior to the procedure, a History and Physical was                         performed, and patient medications and allergies were                         reviewed. The patient is competent. The risks and                         benefits of the procedure and the sedation options and                         risks were discussed with the patient. All questions                         were answered and informed consent was obtained.                         Patient identification and proposed procedure were                         verified by the physician, the nurse, the                         anesthesiologist, the anesthetist and the technician                         in the endoscopy suite. Mental Status Examination:                         alert and oriented. Airway Examination: normal                         oropharyngeal airway and neck mobility. Respiratory                         Examination: clear to auscultation. CV Examination:                         normal. Prophylactic Antibiotics: The patient does not  require prophylactic antibiotics. Prior                         Anticoagulants: The patient has taken no previous                         anticoagulant or antiplatelet agents. ASA Grade                         Assessment: I  - A normal, healthy patient. After                         reviewing the risks and benefits, the patient was                         deemed in satisfactory condition to undergo the                         procedure. The anesthesia plan was to use monitored                         anesthesia care (MAC). Immediately prior to                         administration of medications, the patient was                         re-assessed for adequacy to receive sedatives. The                         heart rate, respiratory rate, oxygen saturations,                         blood pressure, adequacy of pulmonary ventilation, and                         response to care were monitored throughout the                         procedure. The physical status of the patient was                         re-assessed after the procedure.                        After obtaining informed consent, the endoscope was                         passed under direct vision. Throughout the procedure,                         the patient's blood pressure, pulse, and oxygen                         saturations were monitored continuously. The Endoscope                         was introduced through the mouth, and advanced to the  second part of duodenum. The upper GI endoscopy was                         accomplished without difficulty. The patient tolerated                         the procedure well. Findings:      The examined esophagus was normal.      Patchy mild inflammation characterized by erythema was found in the       gastric body.      The examined duodenum was normal. Impression:            - Normal esophagus.                        - Gastritis.                        - Normal examined duodenum.                        - No specimens collected. Recommendation:        - Return patient to hospital ward for possible                         discharge same day.                        - Advance  diet as tolerated.                        - Continue present medications.                        - Can discontinue IV PPI Procedure Code(s):     --- Professional ---                        [phone], Esophagogastroduodenoscopy, flexible,                         transoral; diagnostic, including collection of                         specimen(s) by brushing or washing, when performed                         (separate procedure) Diagnosis Code(s):     --- Professional ---                        K29.70, Gastritis, unspecified, without bleeding                        K92.0, Hematemesis CPT copyright 2019 American Medical Association. All rights reserved. The codes documented in this report are preliminary and upon coder review may  be revised to meet current compliance requirements. Andrey Farmer MD, MD 01/01/2022 3:53:57 PM Number of Addenda: 0 Note Initiated On: 01/01/2022 2:19 PM Estimated Blood Loss:  Estimated blood loss: none.      [facility]

## 2022-01-01 NOTE — ED Triage Notes (Signed)
Pt c/o chest pain and bloody emesis for the past couple of days, pt drinks on a daily basis, has a hx of GERD.the patient is in NAD on arrival

## 2022-01-01 NOTE — Discharge Summary (Signed)
Physician Discharge Summary   Patient: Danielle Burton MRN: [id number] DOB: January 16, 1996  Admit date:     01/01/2022  Discharge date: 01/01/22  Discharge Physician: Sunnie Nielsen   PCP: Pcp, No   Recommendations at discharge:  Follow up with PCP in 1-2 weeks - they should monitor for EtOH abstinence, CBC/CMP Follow up with GI in 6 weeks   Discharge Diagnoses: Principal Problem:   Hematemesis Active Problems:   Alcohol abuse  Resolved Problems:   * No resolved hospital problems. [facility] Course: 01/01/22 presented to ED complaining of chest pain, hematemesis, ongoing for 3 days brought up a "quarter size" amount of blood over 2 episodes of vomiting this morning, reports associated nausea, reports normal bowel movements without bloody/dark stool.  Describes sharp feeling on left side of chest, no associated SOB/pleuritic chest pain, no fever/cough.  PMH GERD, not on any home medications, EtOH use describing as much as 1/5 of liquor daily in the past and lately closer to 4-5 shots per night, last drink yesterday evening.  In ED, vitals and CBC stable, ED physician spoke with gastroenterology and per his report they are planning for EGD hopefully later today.  Patient was given Protonix IV, kept NPO. EGD (+)gastritis and GI cleared for discharge home. Pt was counseled on EtOH abstinence and s/s withdrawal and she agrees to seek treatment if needed but feels safe for discharge now   Assessment and Plan: * Hematemesis most likely GI bleed with underlying EtOH use, GERD Given symptoms and risk stratification, unlikely PE Gastroenterology --> gastritis on EGD, no active bleed  IV Protonix --> po PPI for 3 mos / per GI/PCP at follow-up  N.p.o. --> tolerating clears, ok to advance as toelrated   Alcohol abuse Discussed abstinence with patient Of note, patient has specifically requested that medical staff do not mention EtOH use around her family, will honor this request          Consultants: GI Procedures performed: EGD 01/01/22 (+)gastritis, normal esophagus, normal duodenum Disposition:  Diet recommendation:  Discharge Diet Orders (From admission, onward)     Start     Ordered   01/01/22 0000  Diet full liquid       Comments: To advance as tolerated, avoid heavy / acidic foods, avoid alcohol   01/01/22 1736           Clear liquid diet, advance as tolerated DISCHARGE MEDICATION: Allergies as of 01/01/2022       Reactions   Sulfa Antibiotics    Unknown, childhood reaction   Penicillins Rash        Medication List     STOP taking these medications    escitalopram 10 MG tablet Commonly known as: LEXAPRO       TAKE these medications    omeprazole 40 MG capsule Commonly known as: PRILOSEC Take 1 capsule (40 mg total) by mouth daily. What changed:  medication strength how much to take   ondansetron 4 MG disintegrating tablet Commonly known as: Zofran ODT Take 1-2 tablets (4-8 mg total) by mouth every 8 (eight) hours as needed for nausea or vomiting. What changed: how much to take        Discharge Exam: Filed Weights   01/01/22 1458  Weight: 59 kg   Constitutional:  VSS General Appearance: alert, well-developed, well-nourished, NAD Eyes: Normal lids and conjunctive, non-icteric sclera Ears, Nose, Mouth, Throat: Normal appearance Neck: No masses, trachea midline Respiratory: Normal respiratory effort Breath sounds normal, no wheeze/rhonchi/rales Cardiovascular:  S1/S2 normal, no murmur/rub/gallop auscultated No lower extremity edema Gastrointestinal: Nontender, no masses Musculoskeletal:  No clubbing/cyanosis of digits Neurological: No cranial nerve deficit on limited exam Psychiatric: Normal judgment/insight Normal mood and affect   Condition at discharge: good  The results of significant diagnostics from this hospitalization (including imaging, microbiology, ancillary and laboratory) are listed below  for reference.   Imaging Studies: DG Chest 2 View  Result Date: 01/01/2022 CLINICAL DATA:  Chest pain. EXAM: CHEST - 2 VIEW COMPARISON:  Chest radiograph December 07, 2017. FINDINGS: No consolidation. No visible pleural effusions or pneumothorax. Cardiomediastinal silhouette is within normal limits and similar to prior. No displaced fracture. IMPRESSION: No evidence of acute cardiopulmonary disease. Electronically Signed   By: Feliberto Harts M.D.   On: 01/01/2022 10:35    Microbiology: Results for orders placed or performed in visit on 04/08/19  Novel Coronavirus, NAA ([facility])     Status: None   Collection Time: 04/08/19 12:00 AM   Specimen: Oropharyngeal(OP) collection in vial transport medium   OROPHARYNGEA  TESTING  Result Value Ref Range Status   SARS-CoV-2, NAA Not Detected Not Detected Final    Comment: Testing was performed using the cobas(R) SARS-CoV-2 test. This test was developed and its performance characteristics determined by [employer]. This test has not been FDA cleared or approved. This test has been authorized by FDA under an Emergency Use Authorization (EUA). This test is only authorized for the duration of time the declaration that circumstances exist justifying the authorization of the emergency use of in vitro diagnostic tests for detection of SARS-CoV-2 virus and/or diagnosis of COVID-19 infection under section 564(b)(1) of the Act, 21 U.S.C. 829FAO-1(H)(0), unless the authorization is terminated or revoked sooner. When diagnostic testing is negative, the possibility of a false negative result should be considered in the context of a patient's recent exposures and the presence of clinical signs and symptoms consistent with COVID-19. An individual without symptoms of COVID-19 and who is not shedding SARS-CoV-2 virus would expect to have a negati ve (not detected) result in this assay.     Labs: CBC: Recent Labs  Lab 01/01/22 0850  WBC 5.1  HGB  13.7  HCT 40.7  MCV 96.0  PLT 275   Basic Metabolic Panel: Recent Labs  Lab 01/01/22 0850  NA 139  K 3.6  CL 103  CO2 25  GLUCOSE 137*  BUN 12  CREATININE 0.81  CALCIUM 9.1   Liver Function Tests: Recent Labs  Lab 01/01/22 0850  AST 30  ALT 19  ALKPHOS 38  BILITOT 0.8  PROT 7.7  ALBUMIN 4.7   CBG: No results for input(s): GLUCAP in the last 168 hours.  Discharge time spent: less than 30 minutes.  Signed: Sunnie Nielsen, DO [facility] 01/01/2022

## 2022-01-01 NOTE — Plan of Care (Signed)

## 2022-01-01 NOTE — Assessment & Plan Note (Signed)
   Discussed abstinence with patient  Of note, patient has specifically requested that medical staff do not mention EtOH use around her family, will honor this request

## 2022-01-01 NOTE — ED Provider Notes (Signed)
[facility] Provider Note    Event Date/Time   First MD Initiated Contact with Patient 01/01/22 [id number]     (approximate)   History   Chief Complaint Chest Pain and Hematemesis   HPI  Danielle Burton is a 26 y.o. female with past medical history of GERD and alcohol abuse who presents to the ED complaining of chest pain hematemesis.  Patient reports that for the past 3 days she has been dealing with constant pain in the left side of her chest.  She states it feels sharp in her chest, but she denies any associated difficulty breathing and does not have any pain when taking a deep breath.  She has not had any fevers or cough, denies any pain or swelling in her legs.  She does report similar symptoms in the past and has been diagnosed with GERD, but states she does not take any medications for this currently.  She does report feeling nauseous since waking up this morning and has had 2 episodes of vomiting with blood in it.  She describes a "quarter size" amount of blood each time, had a bowel movement earlier this morning that was normal with no blood or dark tarry stool.  She denies any history of GI bleeding in the past, does admit to daily alcohol consumption.  She states she was drinking as much as a 1/5th of liquor daily in the past, has recently cut back and has been consuming 4-5 shots per night.  Her last drink was yesterday evening.  She states her LMP was about 1 week ago.     Physical Exam   Triage Vital Signs: ED Triage Vitals [01/01/22 0849]  Enc Vitals Group     BP (!) 144/71     Pulse Rate 75     Resp 18     Temp 98.5 F (36.9 C)     Temp Source Oral     SpO2 99 %     Weight      Height      Head Circumference      Peak Flow      Pain Score      Pain Loc      Pain Edu?      Excl. in GC?     Most recent vital signs: Vitals:   01/01/22 0849 01/01/22 1005  BP: (!) 144/71 133/73  Pulse: 75 76  Resp: 18 14  Temp: 98.5 F (36.9 C)    SpO2: 99% 98%    Constitutional: Alert and oriented. Eyes: Conjunctivae are normal. Head: Atraumatic. Nose: No congestion/rhinnorhea. Mouth/Throat: Mucous membranes are moist.  Cardiovascular: Normal rate, regular rhythm. Grossly normal heart sounds.  2+ radial pulses bilaterally. Respiratory: Normal respiratory effort.  No retractions. Lungs CTAB.  No chest wall tenderness to palpation. Gastrointestinal: Soft and nontender. No distention. Musculoskeletal: No lower extremity tenderness nor edema.  Neurologic:  Normal speech and language. No gross focal neurologic deficits are appreciated.    ED Results / Procedures / Treatments   Labs (all labs ordered are listed, but only abnormal results are displayed) Labs Reviewed  COMPREHENSIVE METABOLIC PANEL - Abnormal; Notable for the following components:      Result Value   Glucose, Bld 137 (*)    All other components within normal limits  CBC  POC OCCULT BLOOD, ED  POC URINE PREG, ED  TYPE AND SCREEN  TROPONIN I (HIGH SENSITIVITY)     EKG  ED ECG REPORT I,  Krystel Fletchall, the attending physiChesley Nooncian, personally viewed and interpreted this ECG.   Date: 01/01/2022  EKG Time: 8:49  Rate: 76  Rhythm: normal sinus rhythm  Axis: Normal  Intervals:none  ST&T Change: None  RADIOLOGY Chest x-ray reviewed and interpreted by me with no infiltrate, edema, or effusion.  PROCEDURES:  Critical Care performed: Yes, see critical care procedure note(s)  .Critical Care Performed by: Chesley NoonJessup, Robecca Fulgham, MD Authorized by: Chesley NoonJessup, Semaya Vida, MD   Critical care provider statement:    Critical care time (minutes):  30   Critical care time was exclusive of:  Separately billable procedures and treating other patients and teaching time   Critical care was necessary to treat or prevent imminent or life-threatening deterioration of the following conditions: GI bleeding.   Critical care was time spent personally by me on the following activities:   Development of treatment plan with patient or surrogate, discussions with consultants, evaluation of patient's response to treatment, examination of patient, ordering and review of laboratory studies, ordering and review of radiographic studies, ordering and performing treatments and interventions, pulse oximetry, re-evaluation of patient's condition and review of old charts   I assumed direction of critical care for this patient from another provider in my specialty: no     Care discussed with: admitting provider     MEDICATIONS ORDERED IN ED: Medications  pantoprazole (PROTONIX) injection 40 mg (has no administration in time range)     IMPRESSION / MDM / ASSESSMENT AND PLAN / ED COURSE  I reviewed the triage vital signs and the nursing notes.                              26 y.o. female with past medical history of GERD and alcohol abuse who presents to the ED complaining of sharp pain in her chest for the past 3 days associated with nausea and hematemesis this morning.  Differential diagnosis includes, but is not limited to, ACS, PE, pneumonia, pneumothorax, Boerhaave syndrome, pancreatitis, hepatitis, anemia, upper GI bleed.  Patient well-appearing and in no acute distress, vital signs are unremarkable and she has a benign abdominal exam.  EKG shows no evidence of arrhythmia or ischemia and I doubt ACS given she is young and without risk factor.  I doubt PE as she is PERC negative, chest x-ray is pending but given her well appearance I doubt Boerhaave syndrome.  Symptoms seem most consistent with alternative GI cause such as GERD, low suspicion for significant upper GI bleed at this time as she reports only vomiting a small amount of blood and hemoglobin is stable.  Remainder of labs are reassuring with BMP showing no electrolyte abnormality or AKI, LFTs within normal limits.  Plan to observe here in the ED for ongoing hematemesis, discuss with GI.  Troponin within normal limits and I doubt  cardiac etiology for her chest pain.  She has had no further hematemesis here in the ED and case was discussed with Dr. Mia CreekLocklear of GI, who recommends admission for endoscopy later this afternoon.  We will start on IV Protonix twice daily but hold off on octreotide.  Case discussed with hospitalist for admission.      FINAL CLINICAL IMPRESSION(S) / ED DIAGNOSES   Final diagnoses:  Hematemesis with nausea  Chest pain, unspecified type     Rx / DC Orders   ED Discharge Orders     None        Note:  This  document was prepared using Conservation officer, historic buildings and may include unintentional dictation errors.   Chesley Noon, MD 01/01/22 1126

## 2022-01-01 NOTE — Progress Notes (Signed)
Patient admitted from ED to room 223 at 1400. Plan for EGD with Dr. Mia Creek today. VSS. No complaints of pain. Ambulatory. Call bell and needs within reach. Mother at bedside.

## 2022-01-01 NOTE — Consult Note (Signed)
Consultation  Referring Provider:   ED   Admit date: 01/01/2022 Consult date: 01/01/2022         Reason for Consultation: Hematemesis              HPI:   Danielle Burton is a 26 y.o. lady with history of alcohol abuse who presented with two episodes of hematemesis. She does not have any prior medical history. No history of hematemesis. No blood thinners. She did not have several episodes of vomiting prior to hematemesis. She has normal platelets and normal hemoglobin, no evidence of cirrhosis. She drinks 5 shots daily. Has family history of alcohol abuse. No NSAIDS.   Past Medical History:  Diagnosis Date   GERD (gastroesophageal reflux disease)     History reviewed. No pertinent surgical history.  No family history on file.   Social History   Tobacco Use   Smoking status: Former    Packs/day: 0.50    Types: Cigarettes   Smokeless tobacco: Never  Substance Use Topics   Alcohol use: Yes    Alcohol/week: 5.0 standard drinks    Types: 5 Shots of liquor per week    Comment: daily   Drug use: No    Prior to Admission medications   Medication Sig Start Date End Date Taking? Authorizing Provider  escitalopram (LEXAPRO) 10 MG tablet TAKE 1 TABLET BY MOUTH EVERY DAY Patient not taking: Reported on 01/01/2022 10/07/19   Doy Mince, PA-C  omeprazole (PRILOSEC) 20 MG capsule Take 1 capsule (20 mg total) by mouth daily. 06/26/20 06/26/21  Chesley Noon, MD  ondansetron (ZOFRAN ODT) 4 MG disintegrating tablet Take 1 tablet (4 mg total) by mouth every 8 (eight) hours as needed for nausea or vomiting. Patient not taking: Reported on 01/01/2022 06/26/20   Chesley Noon, MD    Current Facility-Administered Medications  Medication Dose Route Frequency Provider Last Rate Last Admin   pantoprazole (PROTONIX) injection 40 mg  40 mg Intravenous Q12H Sunnie Nielsen, DO       Current Outpatient Medications  Medication Sig Dispense Refill   escitalopram (LEXAPRO) 10 MG  tablet TAKE 1 TABLET BY MOUTH EVERY DAY (Patient not taking: Reported on 01/01/2022) 90 tablet 1   omeprazole (PRILOSEC) 20 MG capsule Take 1 capsule (20 mg total) by mouth daily. 30 capsule 1   ondansetron (ZOFRAN ODT) 4 MG disintegrating tablet Take 1 tablet (4 mg total) by mouth every 8 (eight) hours as needed for nausea or vomiting. (Patient not taking: Reported on 01/01/2022) 12 tablet 0    Allergies as of 01/01/2022 - Review Complete 01/01/2022  Allergen Reaction Noted   Sulfa antibiotics  04/28/2018   Penicillins Rash 06/15/2016     Review of Systems:    All systems reviewed and negative except where noted in HPI.  Review of Systems  Constitutional:  Negative for chills and fever.  Respiratory:  Negative for shortness of breath.   Cardiovascular:  Negative for chest pain.  Gastrointestinal:  Positive for vomiting. Negative for abdominal pain, blood in stool, constipation, diarrhea and melena.  Genitourinary:  Negative for dysuria.  Musculoskeletal:  Negative for joint pain.  Skin:  Negative for rash.  Neurological:  Negative for focal weakness.  Psychiatric/Behavioral:  Positive for substance abuse.   All other systems reviewed and are negative.    Physical Exam:  Vital signs in last 24 hours: Temp:  [98.2 F (36.8 C)-98.5 F (36.9 C)] 98.2 F (36.8 C) (05/24 1254) Pulse Rate:  [46-78] 70 (05/24  1254) Resp:  [14-18] 17 (05/24 1254) BP: (120-144)/(71-80) 130/78 (05/24 1254) SpO2:  [97 %-100 %] 99 % (05/24 1254)   General:   Pleasant in NAD Head:  Normocephalic and atraumatic. Eyes:   No icterus.   Conjunctiva pink. Mouth: Mucosa pink moist, no lesions. Neck:  Supple; no masses felt Lungs:  No respiratory distress Heart:  S1S2, RRR, no MRG. No edema. Abdomen:   Flat, soft, nondistended, nontender Msk:  MAEW x4, No clubbing or cyanosis. Strength 5/5. Symmetrical without gross deformities. Neurologic:  Alert and  oriented x4;  Cranial nerves II-XII intact.  Skin:   Warm, dry, pink without significant lesions or rashes. Psych:  Alert and cooperative. Normal affect.  LAB RESULTS: Recent Labs    01/01/22 0850  WBC 5.1  HGB 13.7  HCT 40.7  PLT 275   BMET Recent Labs    01/01/22 0850  NA 139  K 3.6  CL 103  CO2 25  GLUCOSE 137*  BUN 12  CREATININE 0.81  CALCIUM 9.1   LFT Recent Labs    01/01/22 0850  PROT 7.7  ALBUMIN 4.7  AST 30  ALT 19  ALKPHOS 38  BILITOT 0.8   PT/INR No results for input(s): LABPROT, INR in the last 72 hours.  STUDIES: DG Chest 2 View  Result Date: 01/01/2022 CLINICAL DATA:  Chest pain. EXAM: CHEST - 2 VIEW COMPARISON:  Chest radiograph December 07, 2017. FINDINGS: No consolidation. No visible pleural effusions or pneumothorax. Cardiomediastinal silhouette is within normal limits and similar to prior. No displaced fracture. IMPRESSION: No evidence of acute cardiopulmonary disease. Electronically Signed   By: Feliberto Harts M.D.   On: 01/01/2022 10:35       Impression / Plan:   26 y/o lady with history of alcohol abuse who presents with two episodes of small volume hematemesis. No evidence for cirrhosis. Differential includes gastritis, mallory weiss, PUD, and dieulafoy  - PPI IV BID - will tentatively plan on EGD today - if EGD unremarkable then can likely be discharged today - maintain two large bore IV's - further recs after EGD  Merlyn Lot MD, MPH [facility]

## 2022-01-01 NOTE — Assessment & Plan Note (Addendum)
most likely GI bleed with underlying EtOH use, GERD Given symptoms and risk stratification, unlikely PE  Gastroenterology --> gastritis on EGD, no active bleed   IV Protonix --> po PPI for 3 mos / per GI/PCP at follow-up   N.p.o. --> tolerating clears, ok to advance as toelrated

## 2022-01-01 NOTE — ED Notes (Signed)
Pt states that 2 days ago she started having chest pain. Pt states that she felt like she could not catch her breath. Pt states that the pain comes and goes. Pt denies anything making the pain worse or better. Pt states that she has not injured the area that she is aware of.   Pt states that this morning that she had 2 episodes of vomiting. Pt reports that there was blood in her vomit both times. Pt states that there was about a quarter size amount of blood in her vomit. Pt reports that it was dark red in color. Pt denies abdominal pain. Pt denies nausea at this time. Pt last vomited at 0730.

## 2022-01-05 NOTE — Anesthesia Postprocedure Evaluation (Signed)
Anesthesia Post Note  Patient: Barb N Moultrie  Procedure(s) Performed: ESOPHAGOGASTRODUODENOSCOPY (EGD) WITH PROPOFOL  Patient location during evaluation: Endoscopy Anesthesia Type: General Level of consciousness: awake and alert Pain management: pain level controlled Vital Signs Assessment: post-procedure vital signs reviewed and stable Respiratory status: spontaneous breathing, nonlabored ventilation, respiratory function stable and patient connected to nasal cannula oxygen Cardiovascular status: blood pressure returned to baseline and stable Postop Assessment: no apparent nausea or vomiting Anesthetic complications: no   No notable events documented.   Last Vitals:  Vitals:   01/01/22 1608 01/01/22 1700  BP: 113/83 109/76  Pulse: (!) 53 (!) 59  Resp: 14 16  Temp:  36.9 C  SpO2: 100% 99%    Last Pain:  Vitals:   01/01/22 1700  TempSrc: Oral  PainSc: 0-No pain                 Martha Clan

## 2022-03-29 ENCOUNTER — Other Ambulatory Visit (HOSPITAL_BASED_OUTPATIENT_CLINIC_OR_DEPARTMENT_OTHER): Payer: Self-pay | Admitting: Osteopathic Medicine
